# Patient Record
Sex: Female | Born: 1965
Health system: Southern US, Community
[De-identification: ages and names within clinical notes are randomized; demographics above are authoritative.]

## PROBLEM LIST (undated history)

## (undated) DIAGNOSIS — T7840XA Allergy, unspecified, initial encounter: Secondary | ICD-10-CM

## (undated) DIAGNOSIS — F419 Anxiety disorder, unspecified: Secondary | ICD-10-CM

## (undated) HISTORY — DX: Allergy, unspecified, initial encounter: T78.40XA

## (undated) HISTORY — DX: Anxiety disorder, unspecified: F41.9

---

## 2004-10-01 ENCOUNTER — Emergency Department: Payer: Self-pay | Admitting: Emergency Medicine

## 2004-10-03 ENCOUNTER — Emergency Department: Payer: Self-pay | Admitting: General Practice

## 2005-02-28 ENCOUNTER — Other Ambulatory Visit: Payer: Self-pay

## 2005-02-28 ENCOUNTER — Emergency Department: Payer: Self-pay | Admitting: Unknown Physician Specialty

## 2005-02-28 IMAGING — CT CT HEAD WITHOUT CONTRAST
1 series · 16 of 29 positions shown, 20 images · non-contrast
Comparison: none

REASON FOR EXAM: syncope   cardiac  rm
COMMENTS:

PROCEDURE:     CT  - CT HEAD WITHOUT CONTRAST  - [DATE] [DATE]
RESULT:        No intra-axial or extra-axial pathologic fluid or blood
collections are identified.  No mass lesion is noted.  There is no
hydrocephalus.

[Series 2: head 5.0 h40s · axial · 0.38mm/px · z∈[+191,+321]mm · 16 of 29 slices shown, 20 images]
[im 2/29  brain]
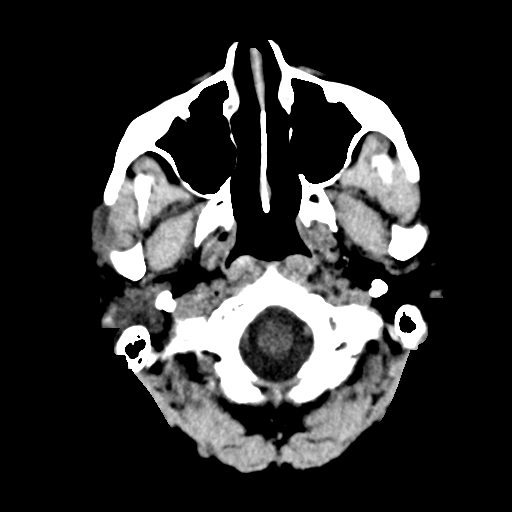
[im 2/29  bone]
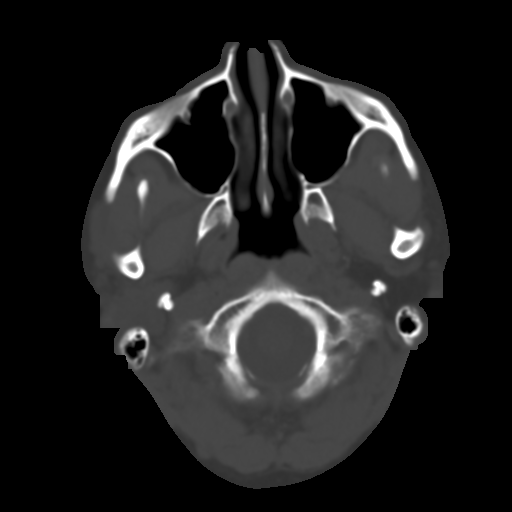
[im 4/29  brain]
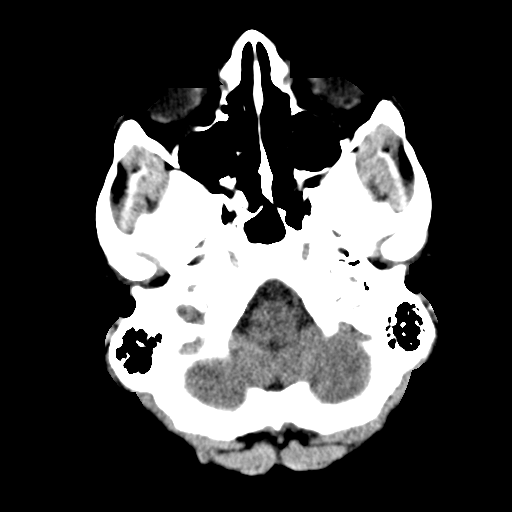
[im 6/29  brain]
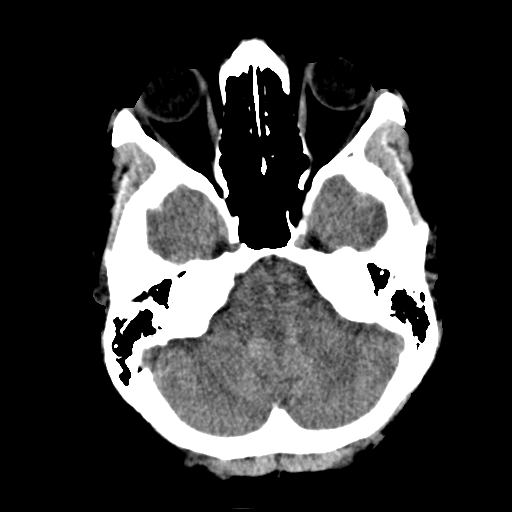
[im 7/29  brain]
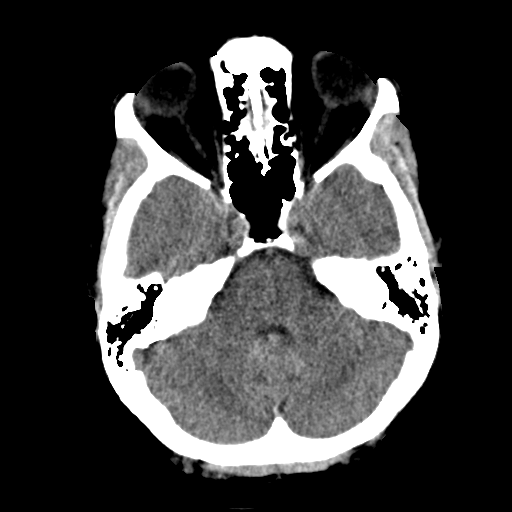
[im 9/29  brain]
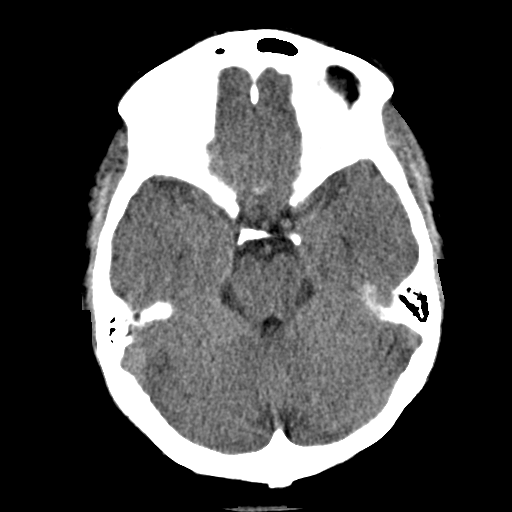
[im 9/29  bone]
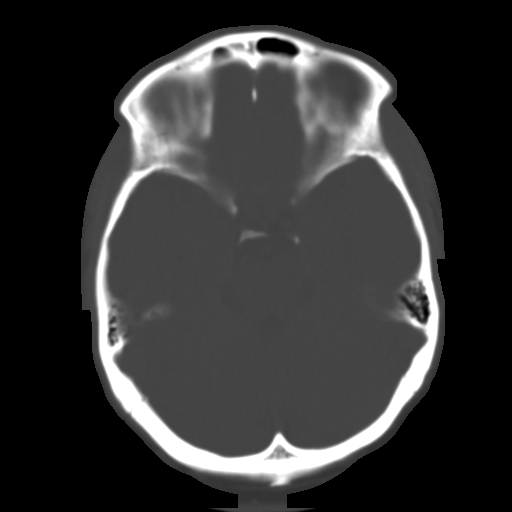
[im 11/29  brain]
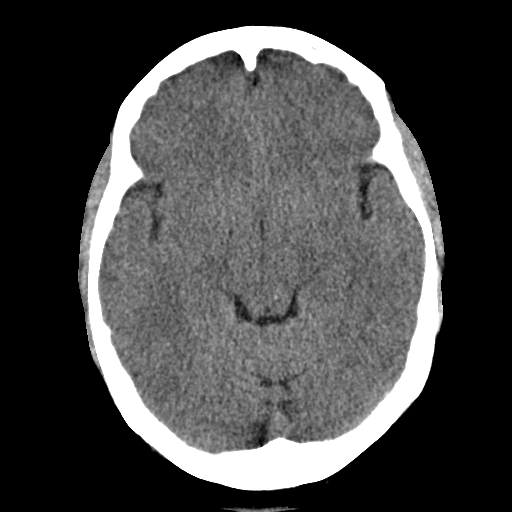
[im 12/29  brain]
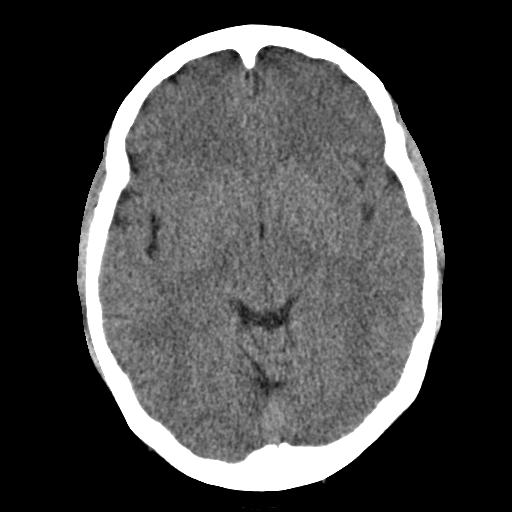
[im 14/29  brain]
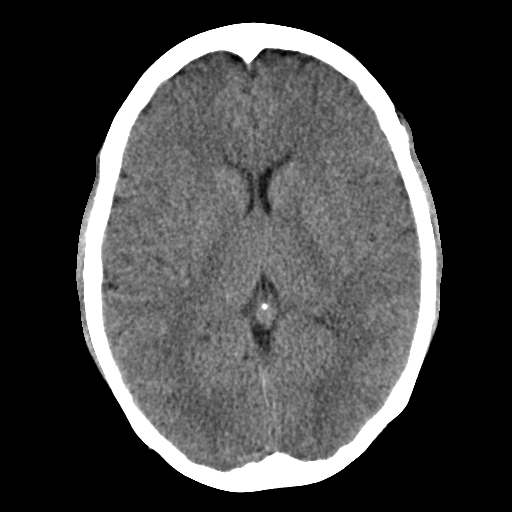
[im 16/29  brain]
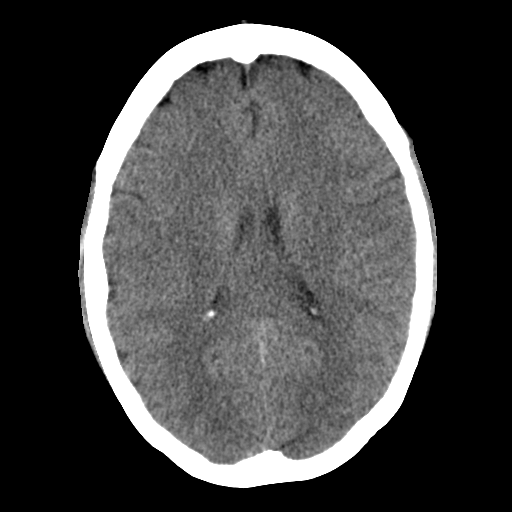
[im 16/29  bone]
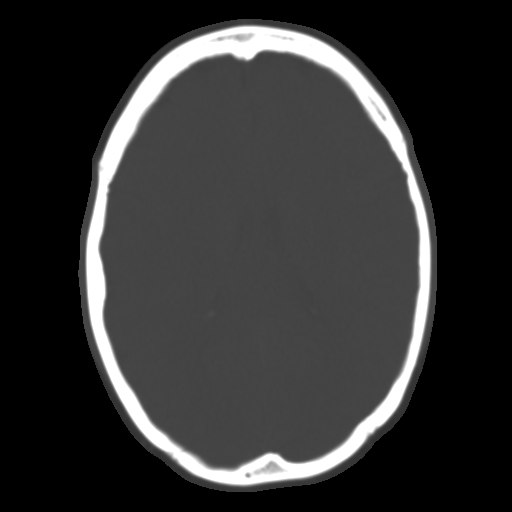
[im 18/29  brain]
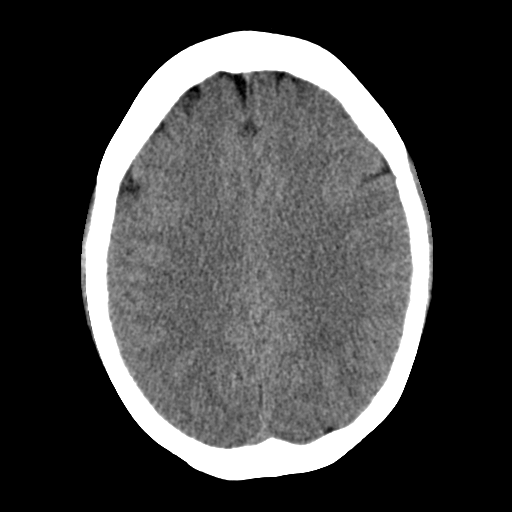
[im 19/29  brain]
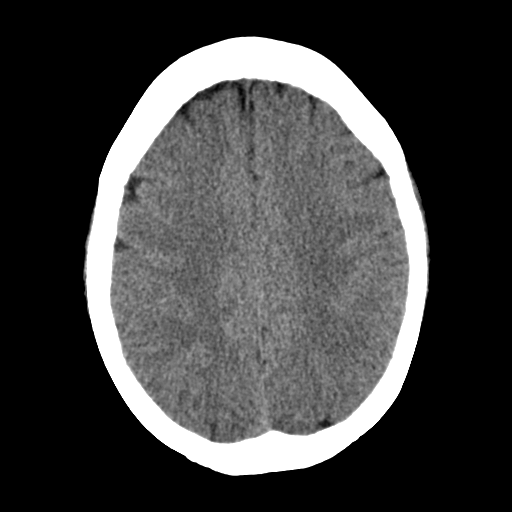
[im 21/29  brain]
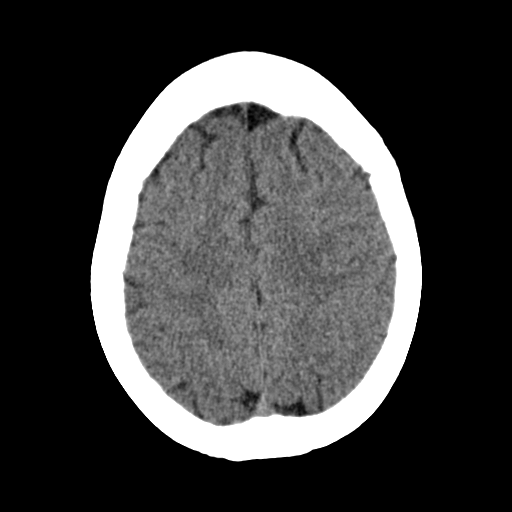
[im 23/29  brain]
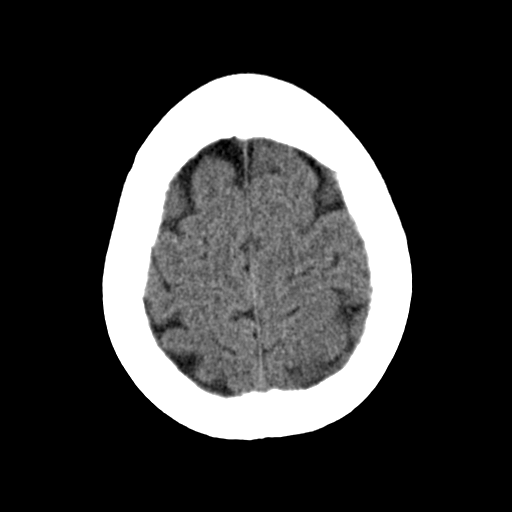
[im 23/29  bone]
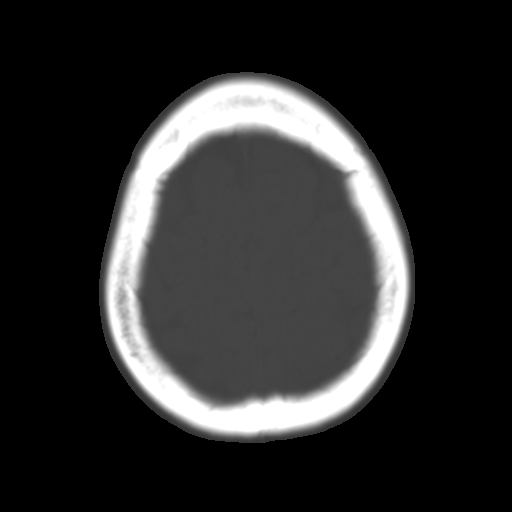
[im 24/29  brain]
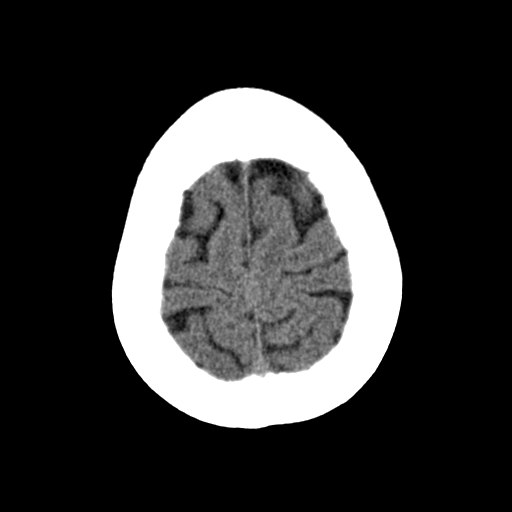
[im 26/29  brain]
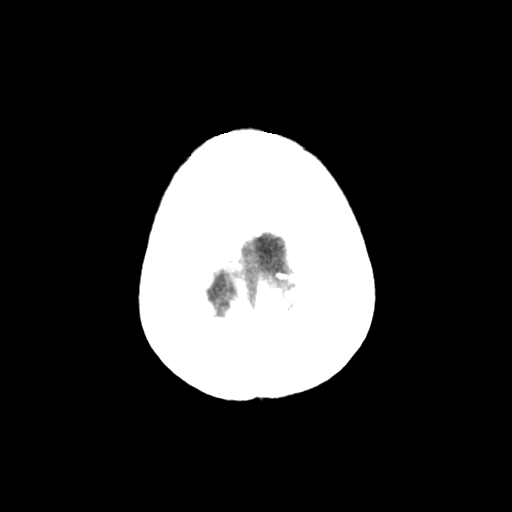
[im 28/29  brain]
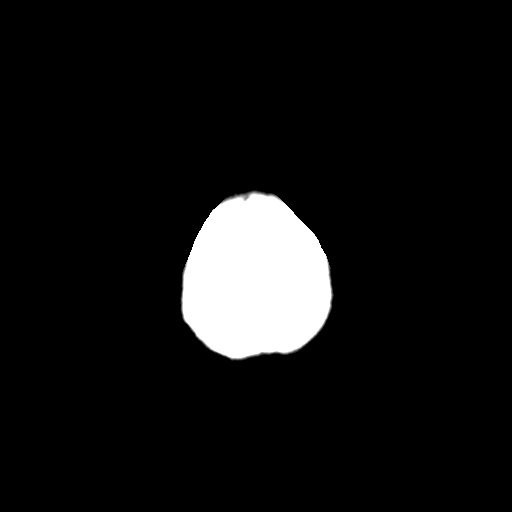

[16 of 29 positions shown; findings below may reference images not displayed]

IMPRESSION: No acute abnormalities.

## 2005-10-17 ENCOUNTER — Ambulatory Visit: Payer: Self-pay

## 2005-10-17 IMAGING — MG UNKNOWN MG STUDY
1 series · 4 of 4 positions shown · non-contrast
Comparison: none

REASON FOR EXAM: Baseline screening mammography
COMMENTS:

PROCEDURE:     MAM - MAM DGTL SCREENING MAMMO W/CAD  - [DATE]  [DATE]
RESULT:        The breasts demonstrate a heterogenous fibronodular
parenchymal pattern.  There does not appear to be radiographic evidence to
suggest malignancy.

[R CC · right · 4 of 4 slices shown]
[im 1/4]
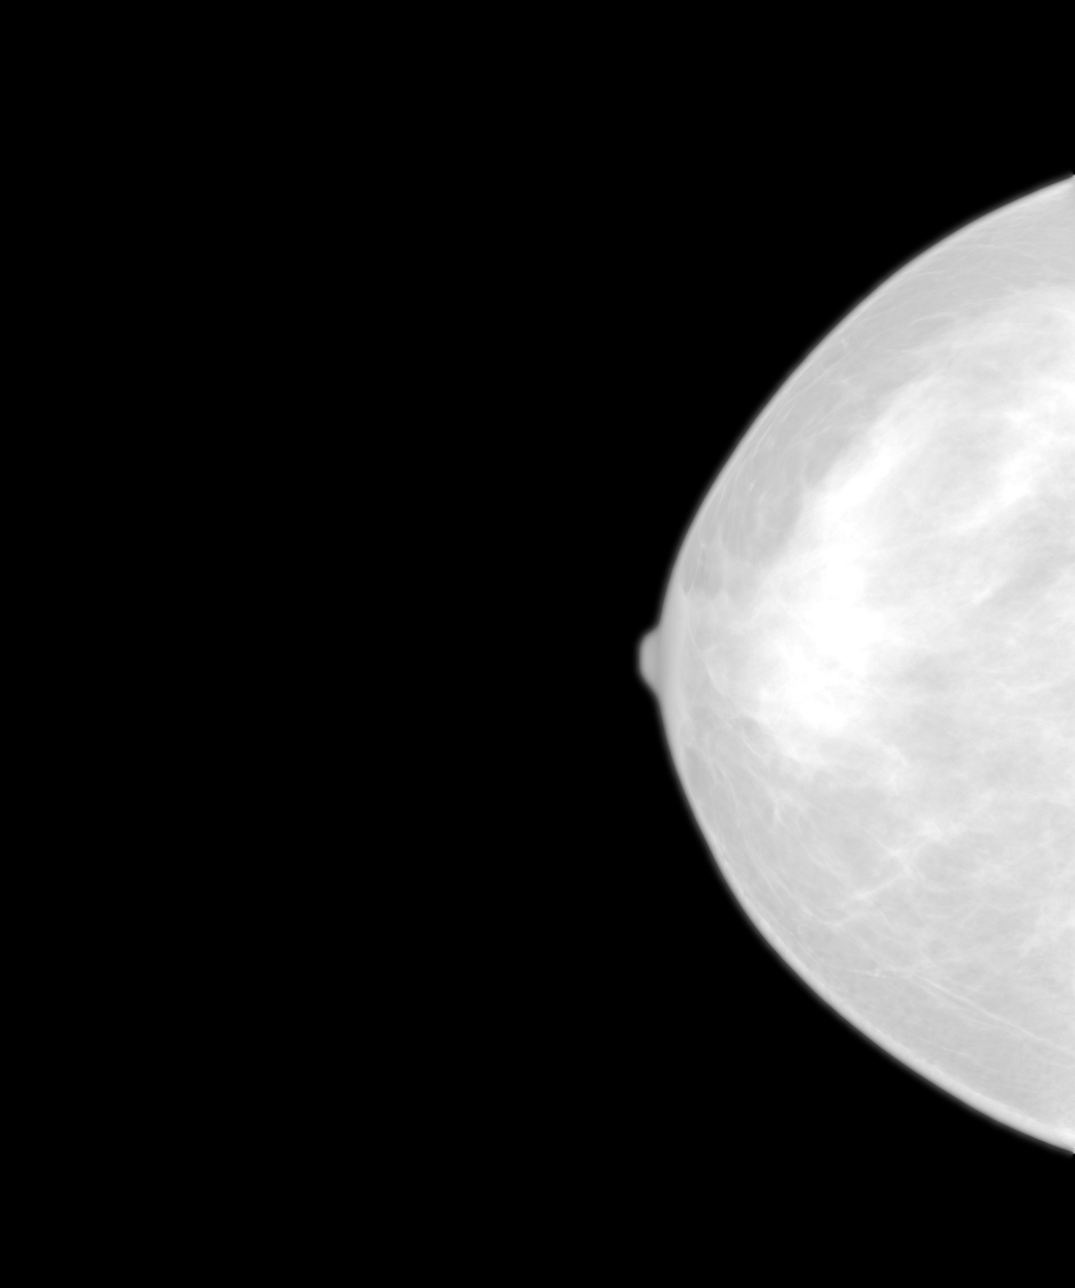
[im 2/4]
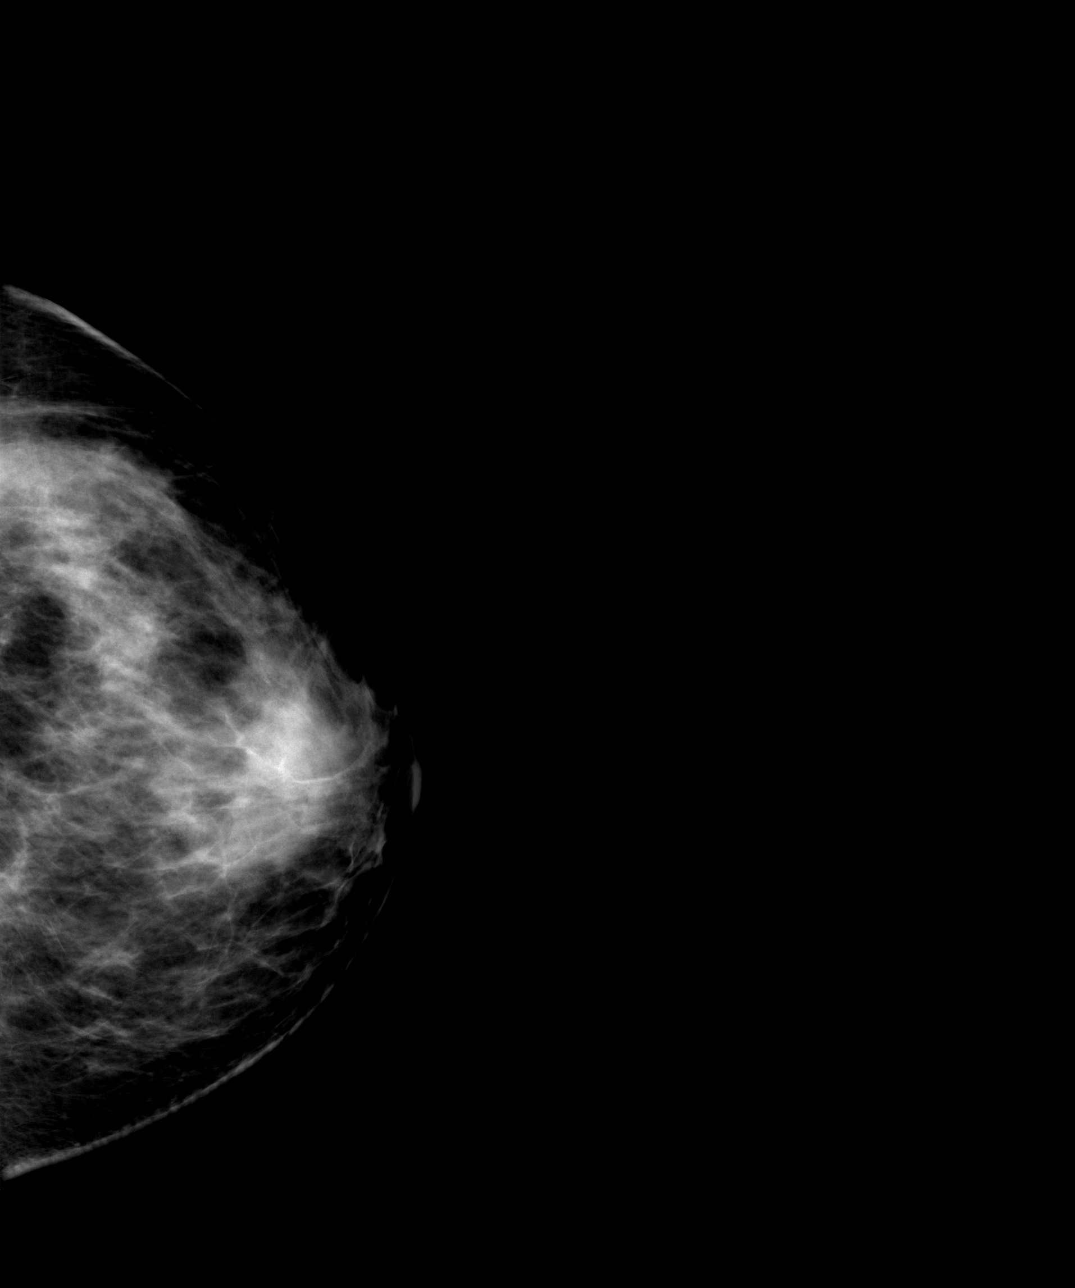
[im 3/4]
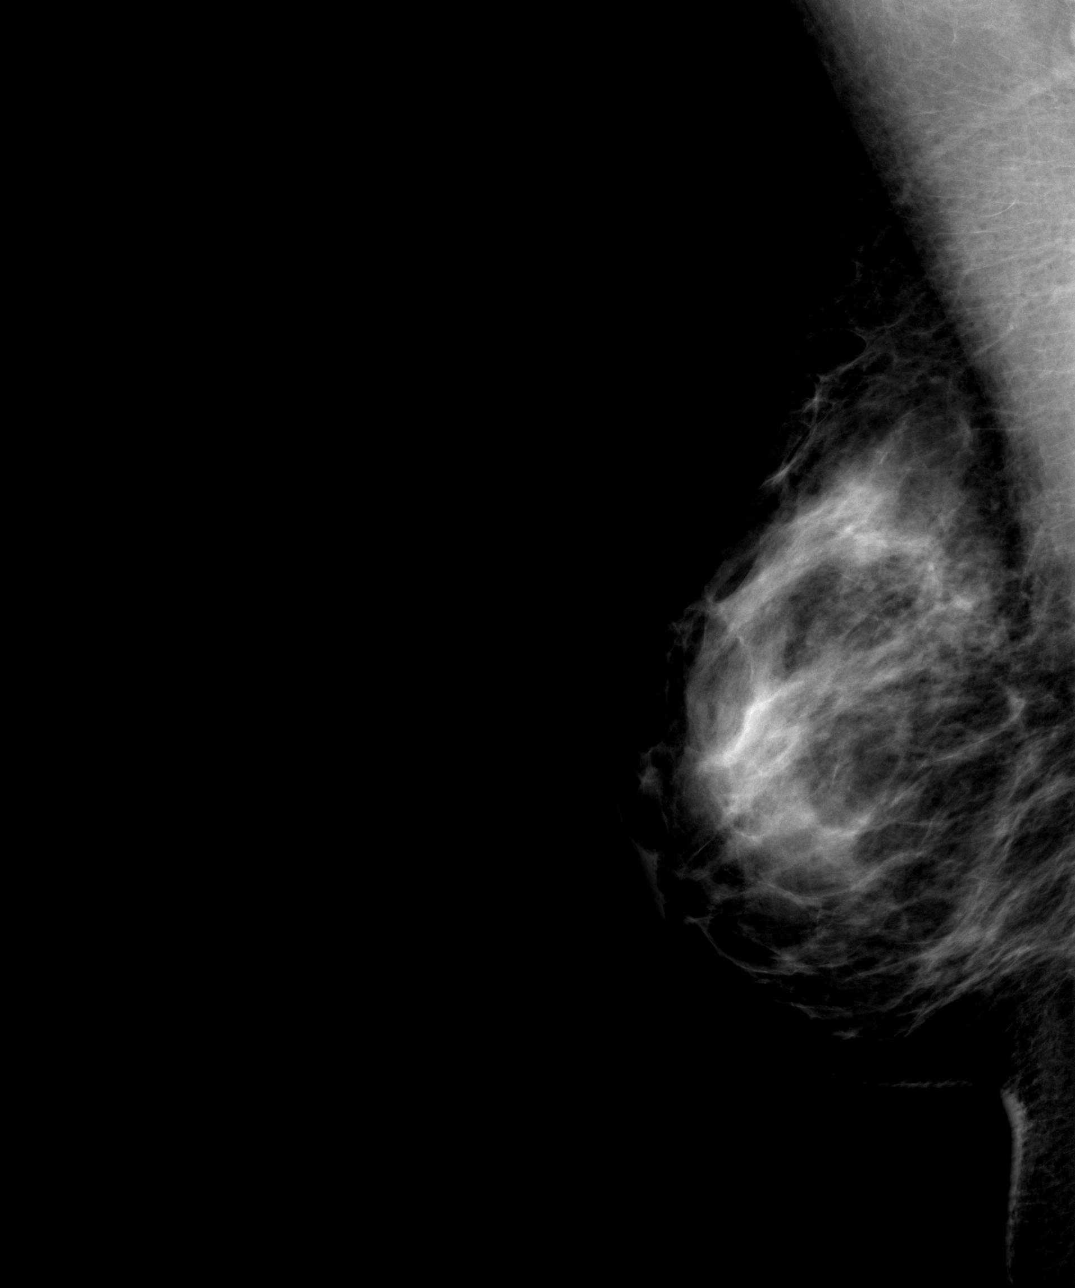
[im 4/4]
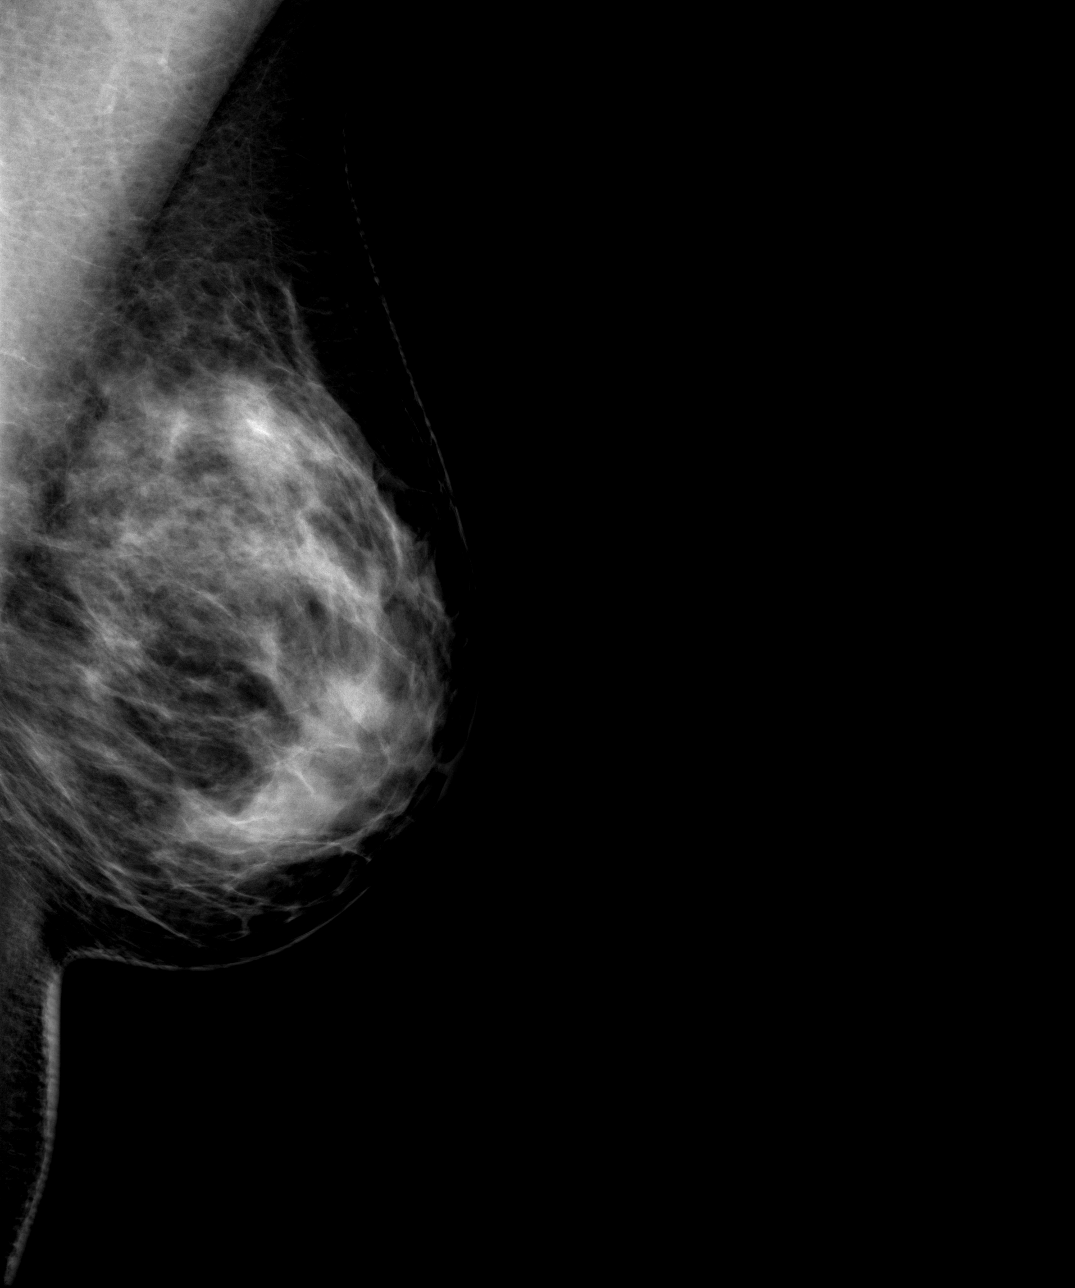

[4 of 4 positions shown; findings below may reference images not displayed]

IMPRESSION: 1.     Benign findings .
2.     BI-RADS: Category 2-Benign Findings.

Thank you for this opportunity to contribute to the care of your patient.

A NEGATIVE MAMMOGRAM REPORT DOES NOT PRECLUDE BIOPSY OR OTHER EVALUATION OF
A CLINICALLY PALPABLE OR OTHERWISE SUSPICIOUS MASS OR LESION.   BREAST

## 2006-03-25 ENCOUNTER — Ambulatory Visit: Payer: Self-pay | Admitting: Urology

## 2006-03-25 IMAGING — CR DG IVP HYPERTENSIVE
1 series · 8 of 10 positions shown · non-contrast
Comparison: none

REASON FOR EXAM: hematuria
COMMENTS:

[Series 1: view not recorded · 0.17mm/px · 8 of 12 slices shown]
[im 1/12]
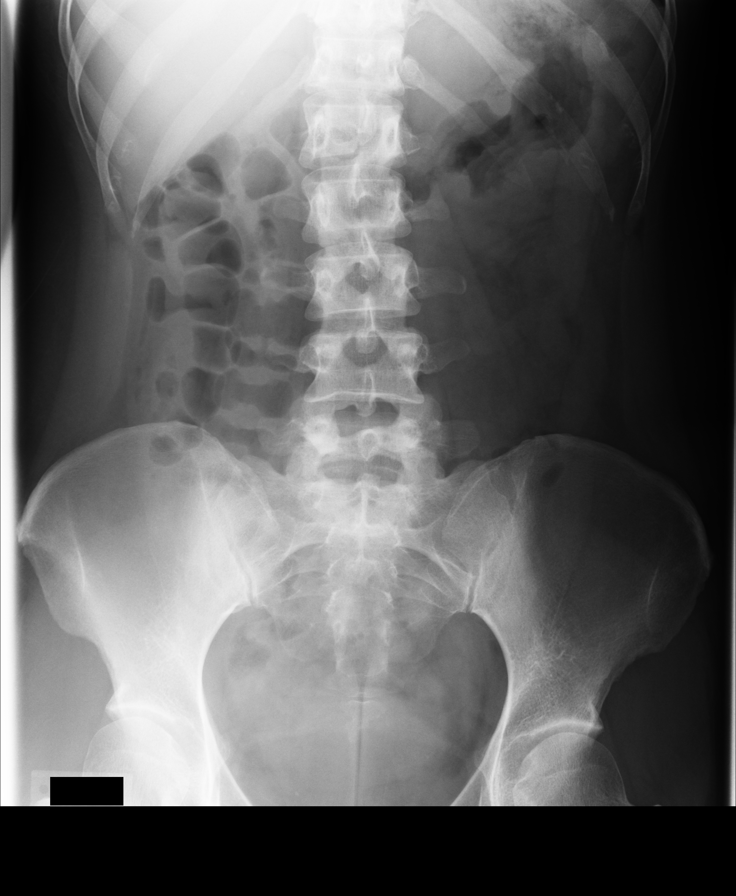
[im 2/12]
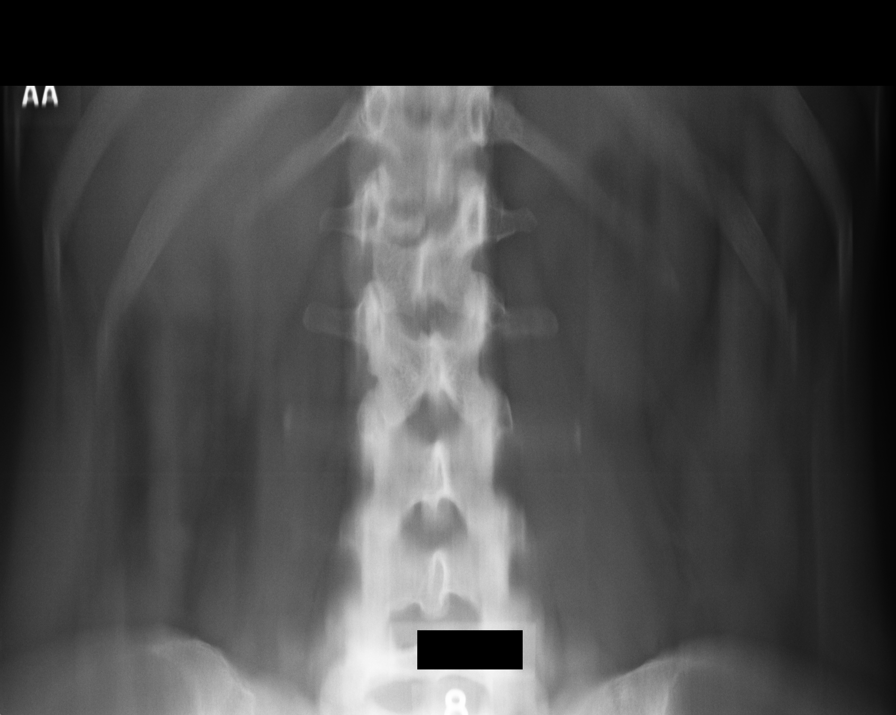
[im 3/12]
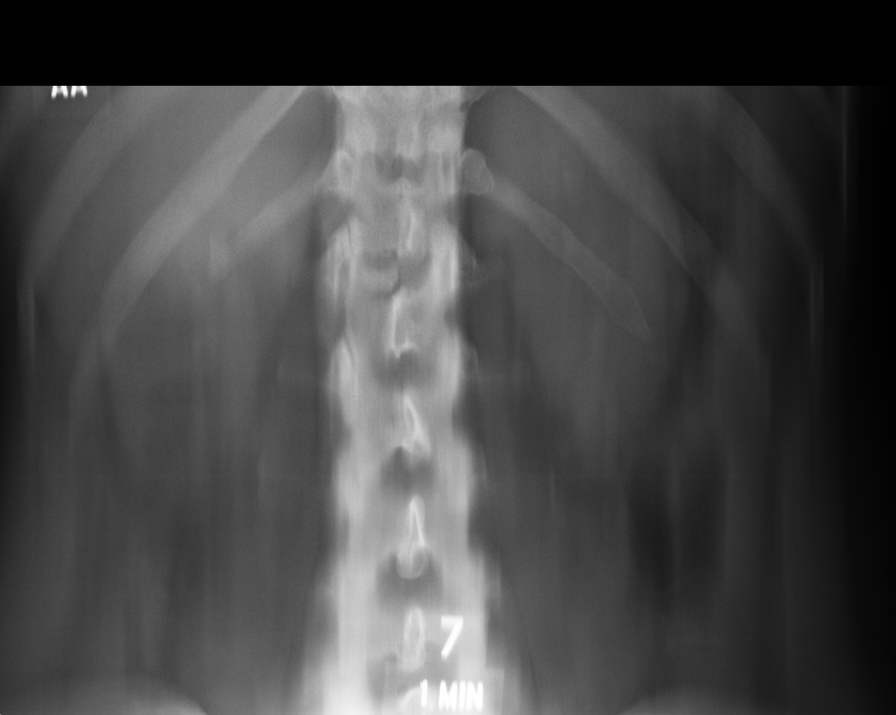
[im 4/12]
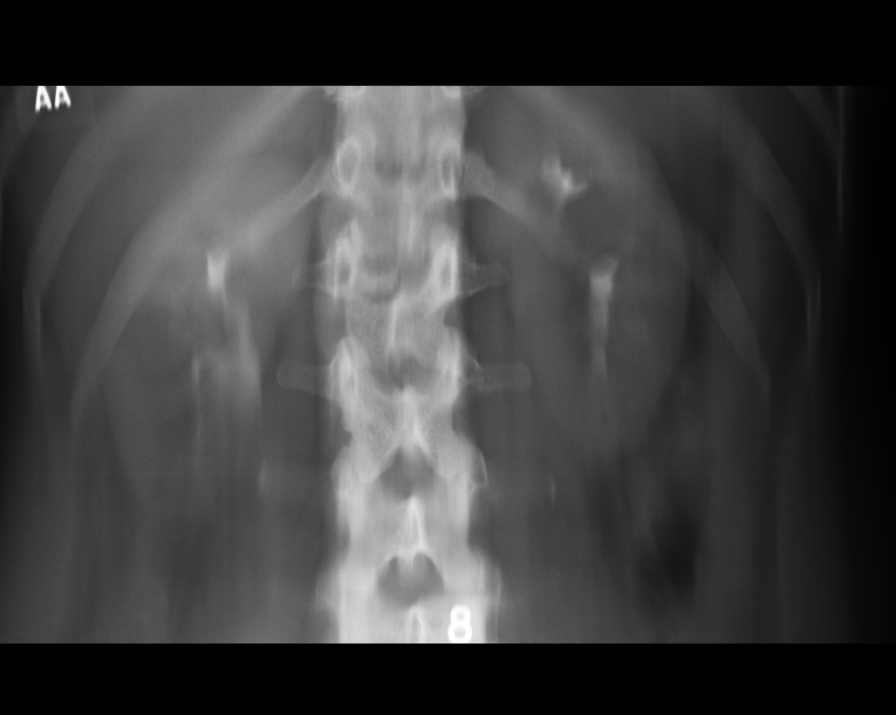
[im 5/12]
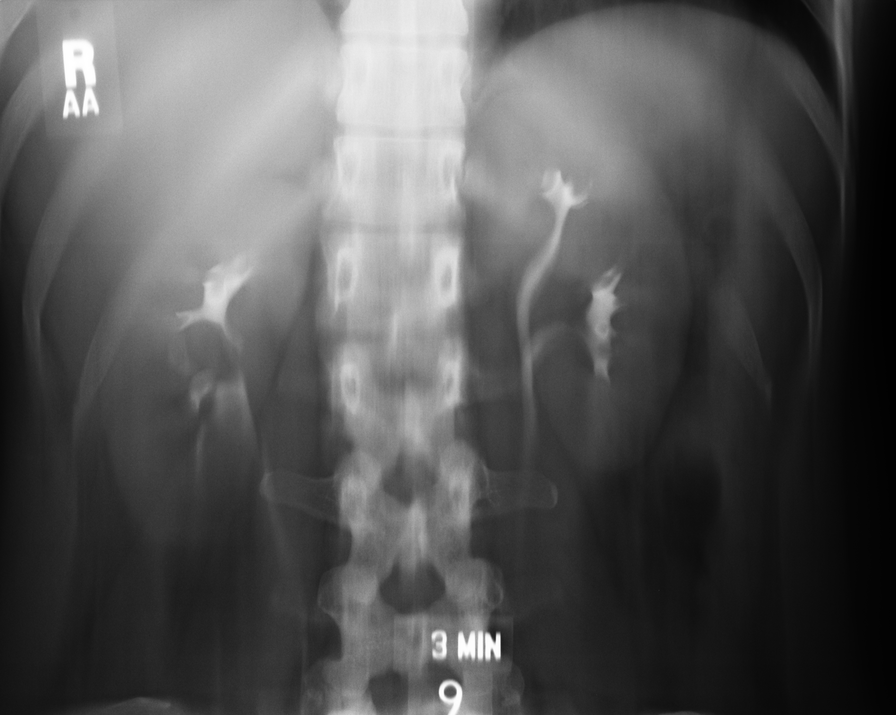
[im 7/12]
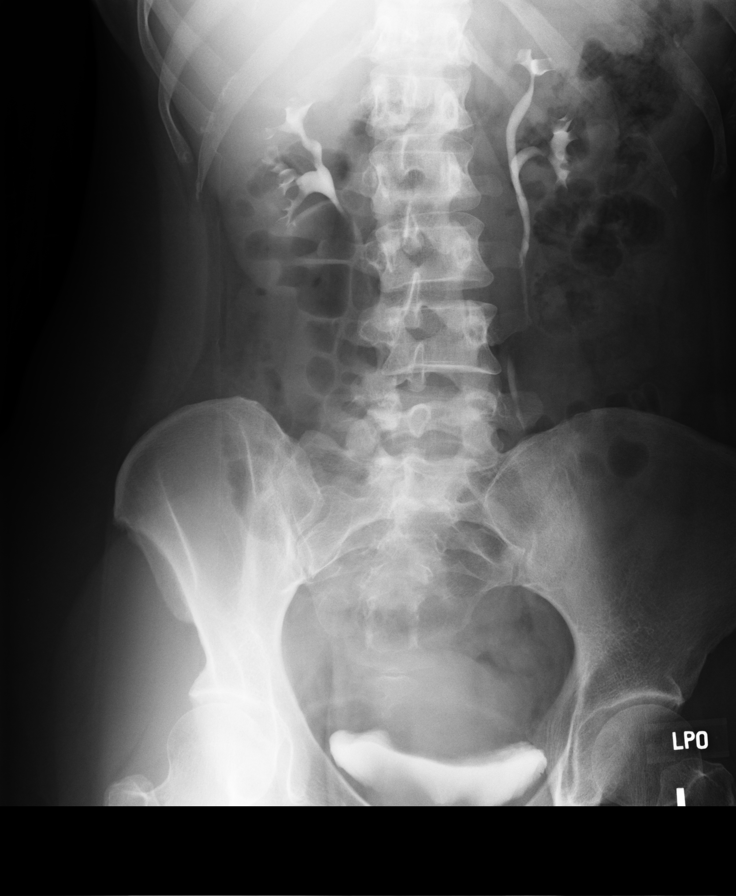
[im 8/12]
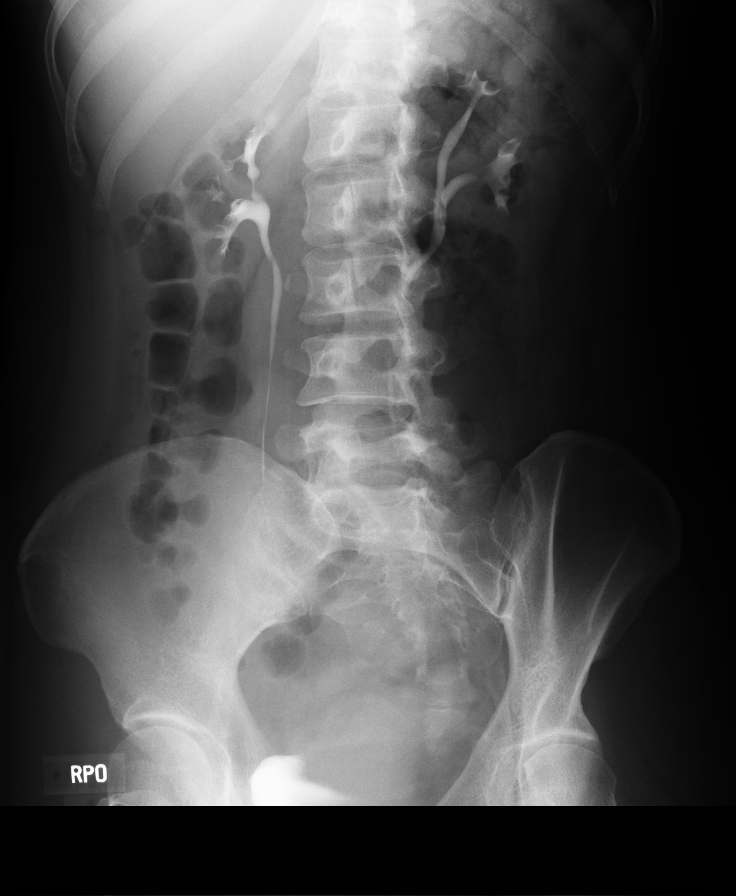
[im 9/12]
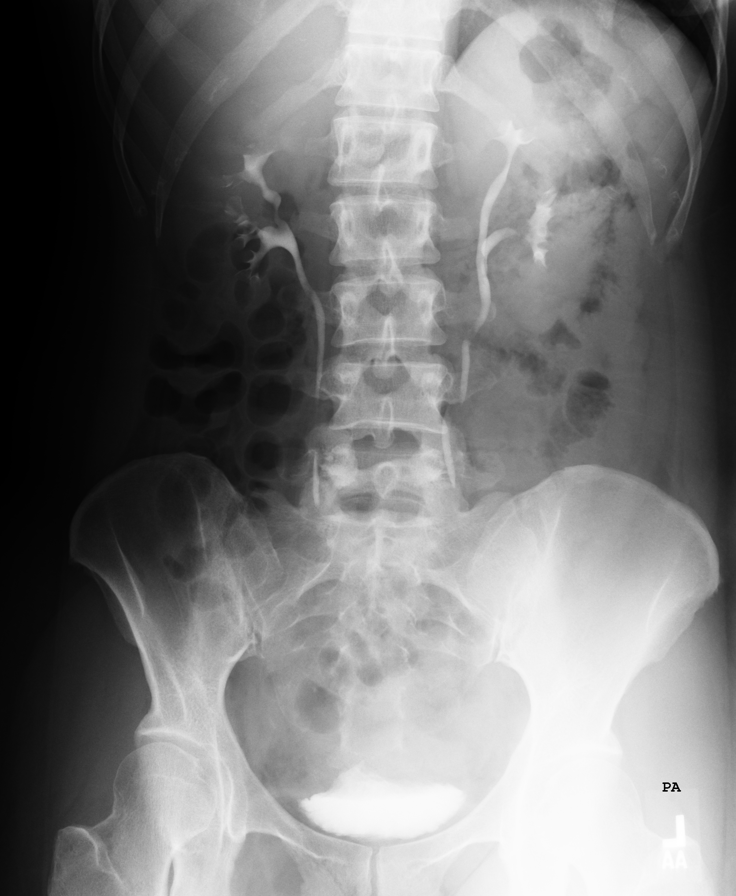

[8 of 10 positions shown; findings below may reference images not displayed]

PROCEDURE:     DXR - DXR INTRAVENOUS UROGRAPHY (IVP)  - [DATE]  [DATE]

RESULT:     The patient has hematuria and clinical bladder infection.  The
patient has no history of kidney stones. The patient is complaining of lower
back pain.

The anticipated procedure was discussed with Ms. ITTI. She voiced her
willingness to proceed.  The scout film reveals a normal bowel gas pattern.
Following intravenous administration of 50 ml of Optiray 320 there is prompt
visualization of the renal outlines.  The RIGHT kidney measures
approximately 12.5 cm in length and the LEFT kidney measures approximately
13.2 cm in length, uncorrected for magnification.  The intrarenal collecting
systems are normal in appearance. The ureters are fractionally visualized
but appeared normal in course and caliber. The partially distended urinary
bladder is normal in appearance. The postvoid film reveals no more than a
small residual urinary bladder volume.
IMPRESSION: 1)Normal IVP.

## 2006-04-17 ENCOUNTER — Ambulatory Visit: Payer: Self-pay | Admitting: Urology

## 2006-05-16 ENCOUNTER — Ambulatory Visit: Payer: Self-pay | Admitting: Gastroenterology

## 2007-10-30 ENCOUNTER — Ambulatory Visit: Payer: Self-pay | Admitting: Internal Medicine

## 2007-10-30 IMAGING — CT CT CHEST W/ CM
1 series · 15 of 32 positions shown, 19 images · non-contrast
Comparison: none

REASON FOR EXAM: CP   SOB   NOT STAT PER OFFICE
COMMENTS:

[Series 4: soft tissue · axial · 0.63mm/px · z∈[-425,-164]mm · 15 of 98 slices shown, 19 images]
[im 7/98  soft-tissue]
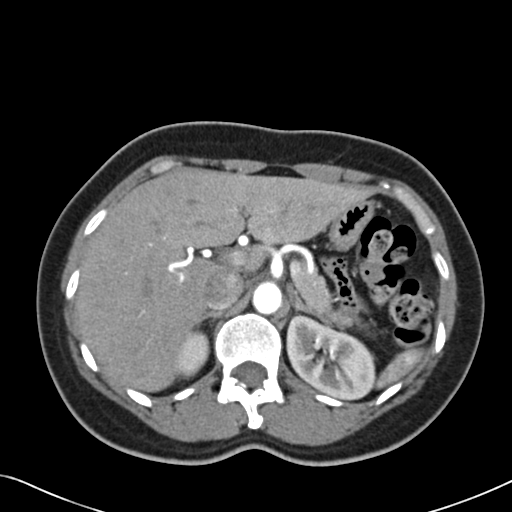
[im 7/98  bone]
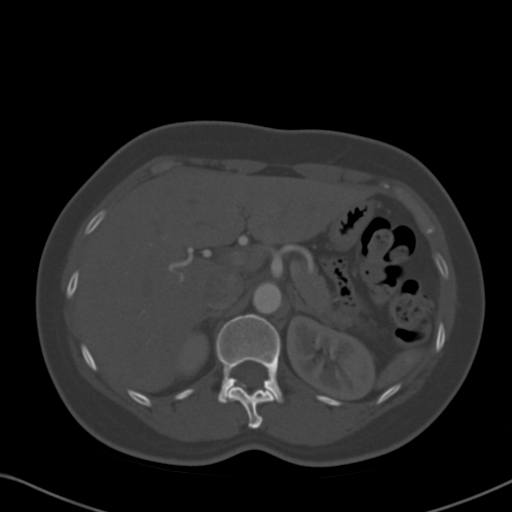
[im 13/98  soft-tissue]
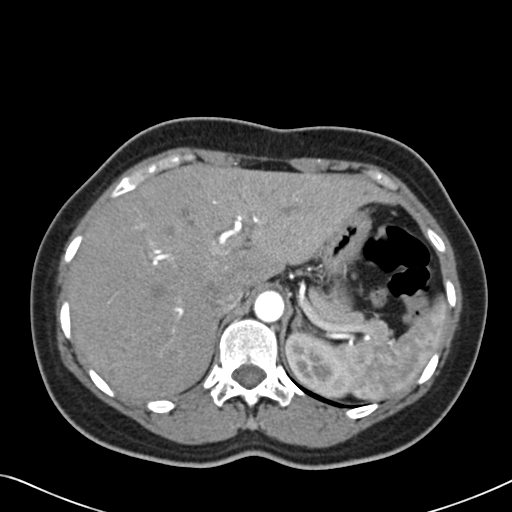
[im 19/98  soft-tissue]
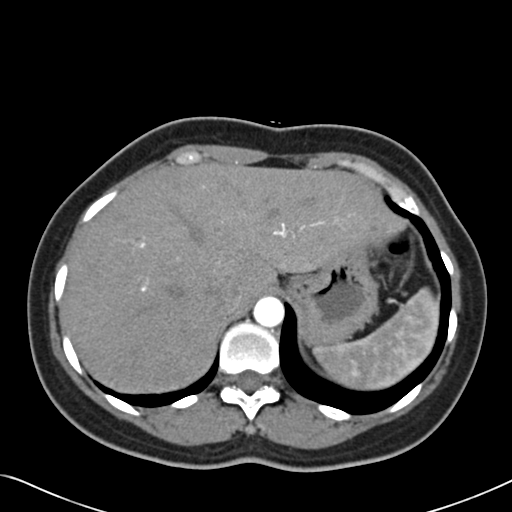
[im 29/98  soft-tissue]
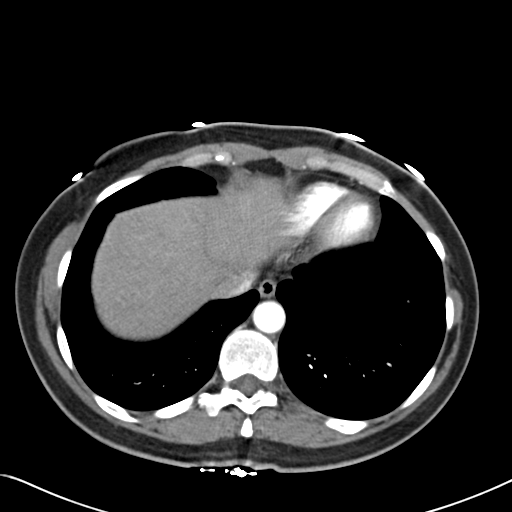
[im 35/98  soft-tissue]
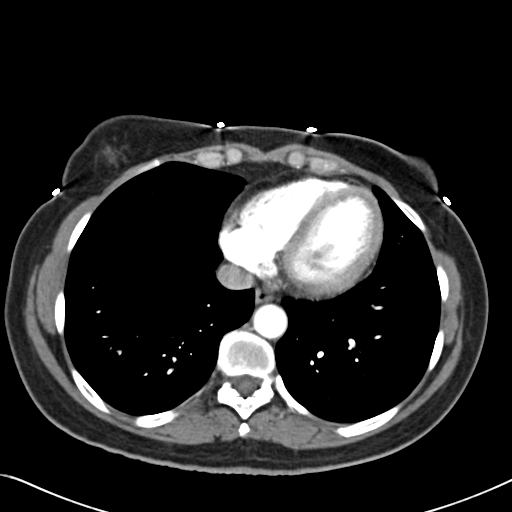
[im 41/98  soft-tissue]
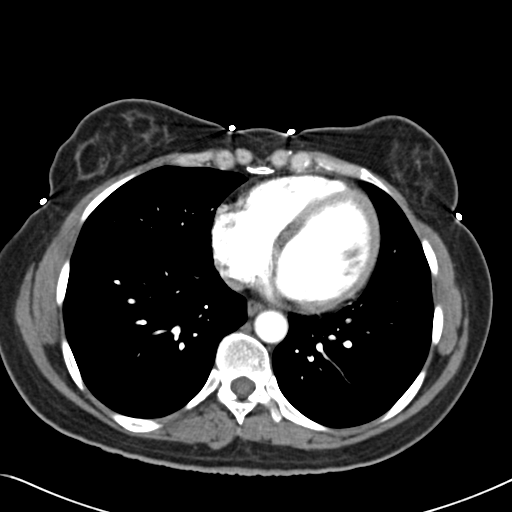
[im 51/98  soft-tissue]
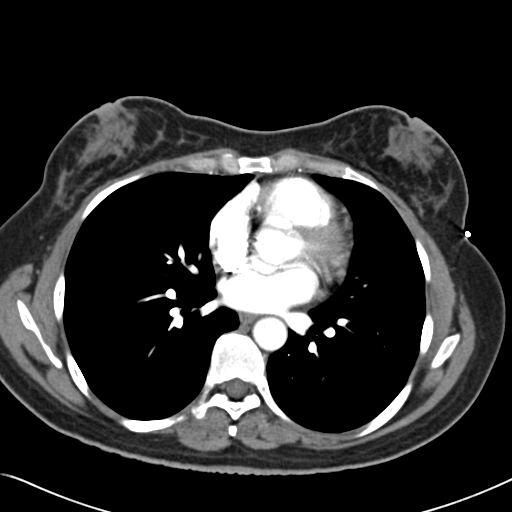
[im 57/98  soft-tissue]
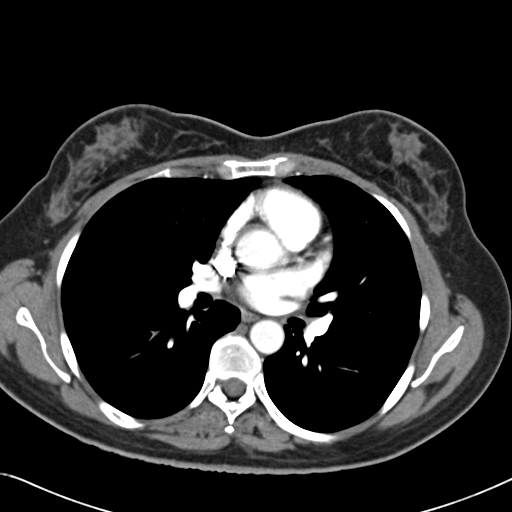
[im 63/98  soft-tissue]
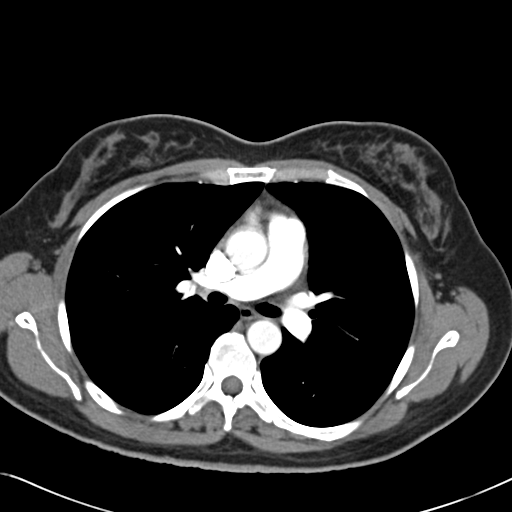
[im 63/98  bone]
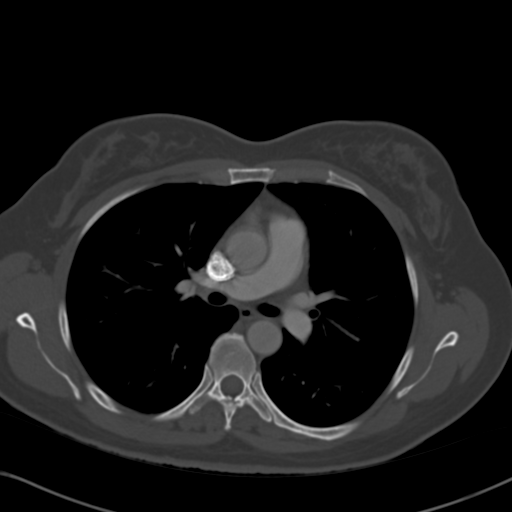
[im 69/98  soft-tissue]
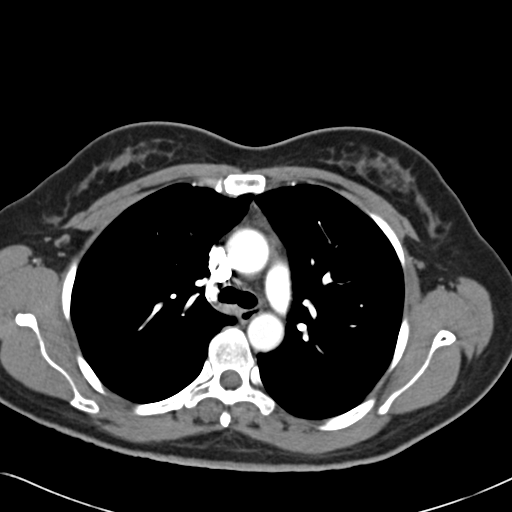
[im 79/98  soft-tissue]
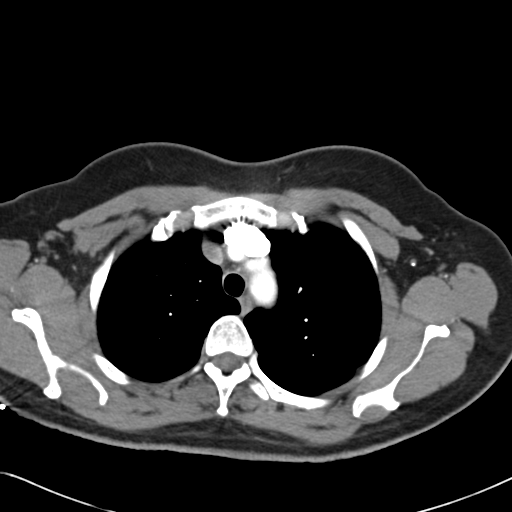
[im 85/98  soft-tissue]
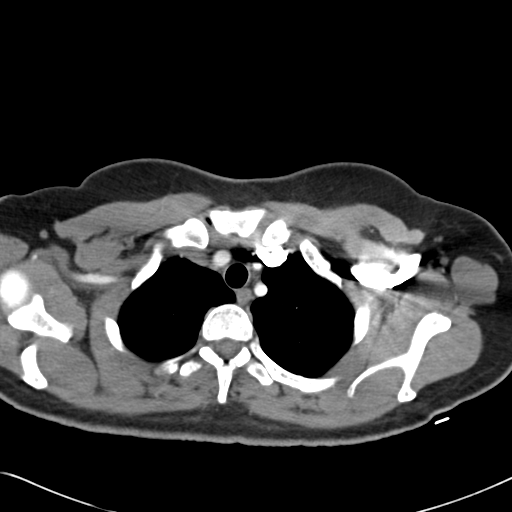
[im 85/98  lung]
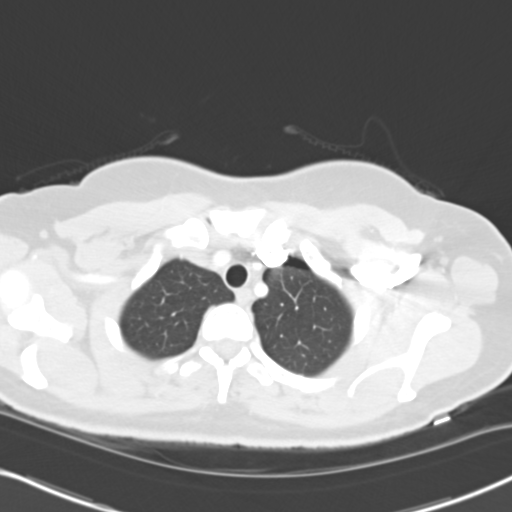
[im 88/98  lung]
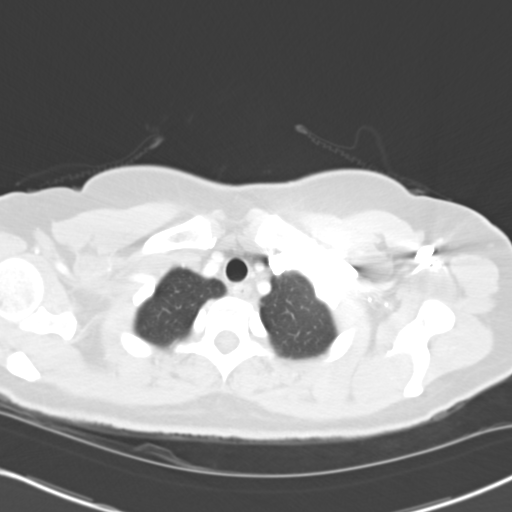
[im 91/98  soft-tissue]
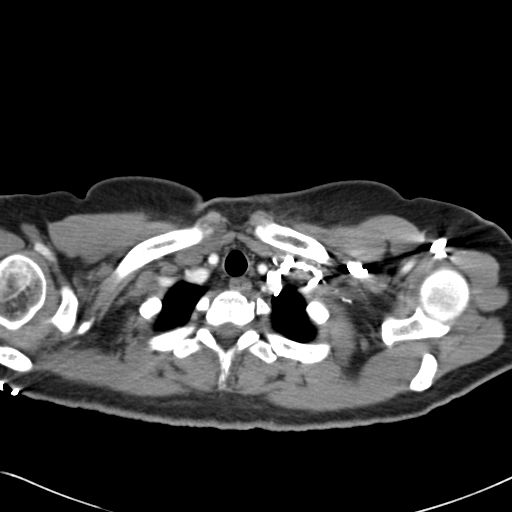
[im 91/98  lung]
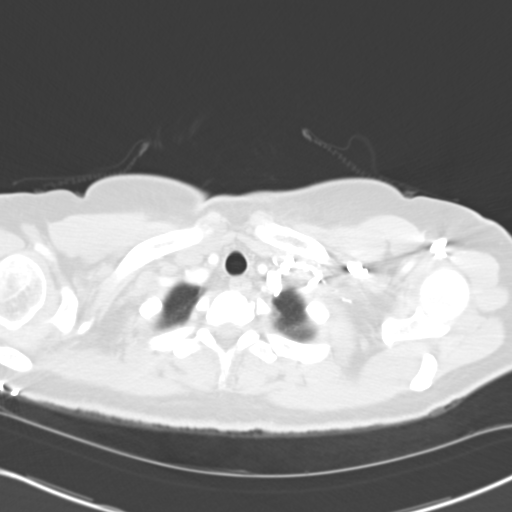
[im 94/98  lung]
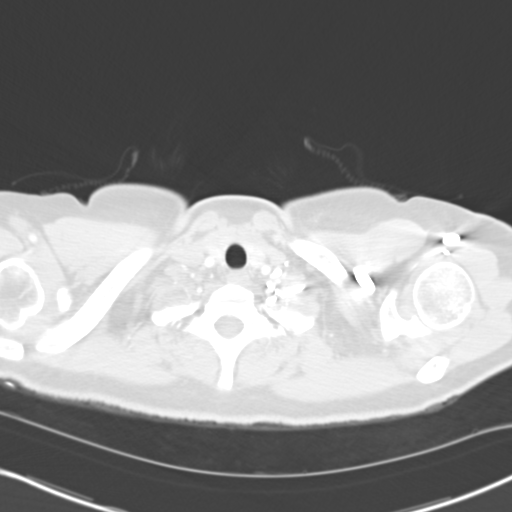

[15 of 32 positions shown; findings below may reference images not displayed]

PROCEDURE:     CT  - CT CHEST (FOR PE) W  - [DATE] [DATE]

RESULT:     The patient received 75 ml of [FW]. The patient is
undergoing evaluation for unexplained chest discomfort.

The caliber of the thoracic aorta is normal. Contrast within the pulmonary
arterial tree also appears normal. The cardiac chambers are normal in size.
No pathologic sized mediastinal or hilar lymph nodes are seen. There is no
pleural or pericardial effusion. There is no evidence of axillary
lymphadenopathy. The thyroid lobes are normal where visualized. At lung
window settings I see no interstitial or alveolar infiltrates. Within the
upper abdomen the observed portions of the liver are normal. There are no
adrenal masses. The spleen is not enlarged. The thoracic vertebral bodies
are preserved in height.
IMPRESSION: 1. There is no evidence of acute pulmonary embolism or acute thoracic aortic
pathology.
2. There is no evidence of CHF or pneumonia or pulmonary parenchymal
lesions.
3. There is no pleural or pericardial effusion.

## 2008-12-21 ENCOUNTER — Ambulatory Visit: Payer: Self-pay

## 2010-11-07 ENCOUNTER — Ambulatory Visit: Payer: Self-pay | Admitting: Obstetrics and Gynecology

## 2010-11-07 IMAGING — MG MAM [PERSON_NAME] SCREENING W/CAD
1 series · 4 of 4 positions shown · non-contrast
Comparison: none

REASON FOR EXAM: OLANE SCR COPY TO OLANE
COMMENTS:

PROCEDURE:     MAM - MAM OLANE SCREENING W/CAD  - [DATE]  [DATE]
RESULT:
COMPARISONS: [DATE] and [DATE].

[R CC · right · 4 of 4 slices shown]
[im 1/4]
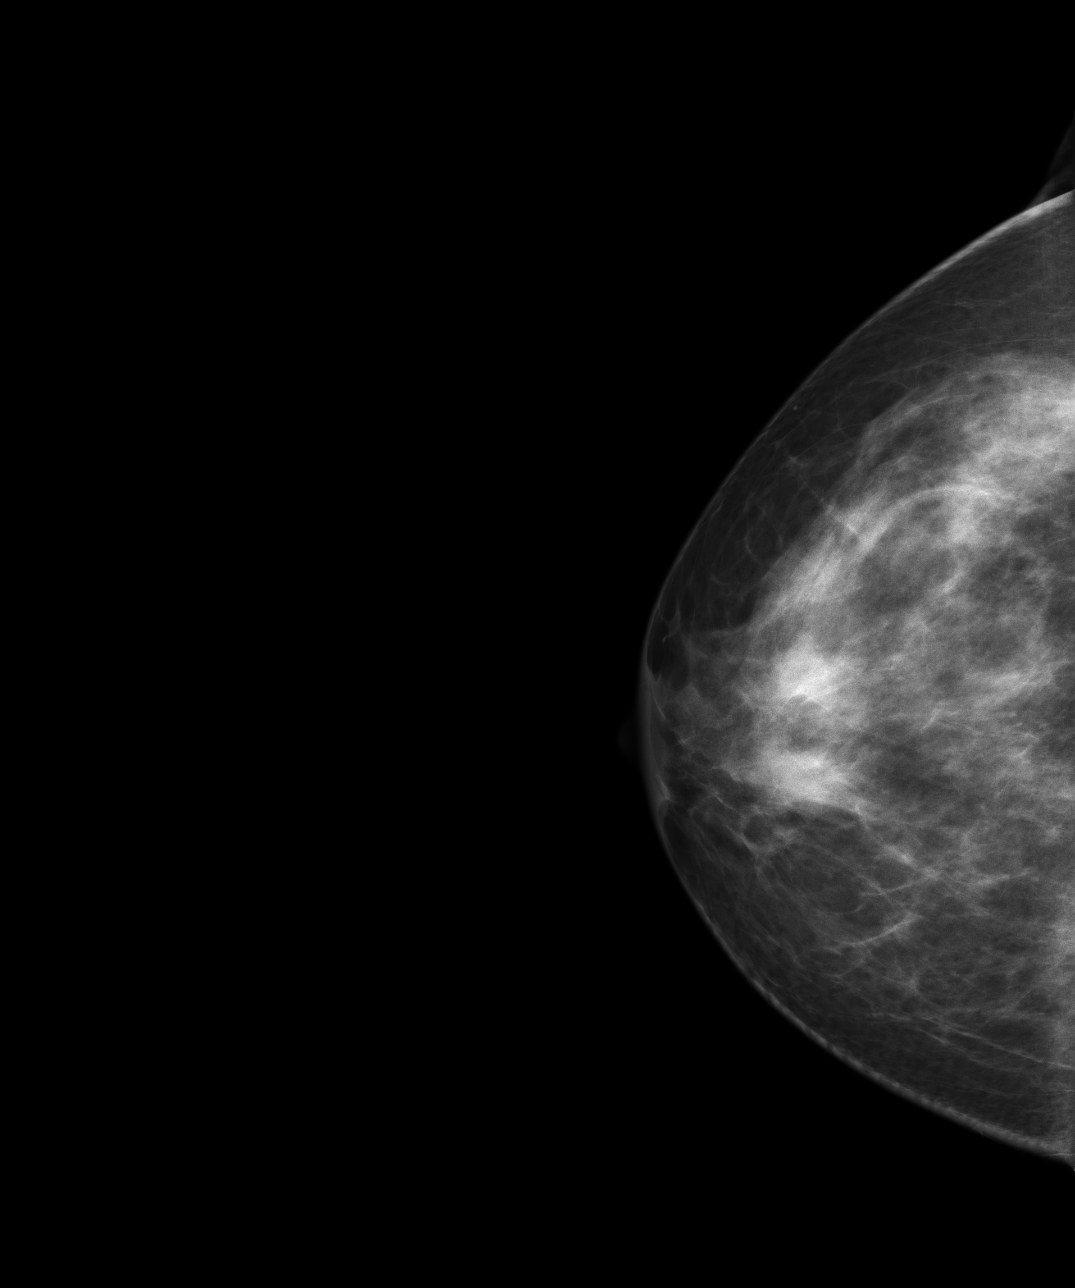
[im 2/4]
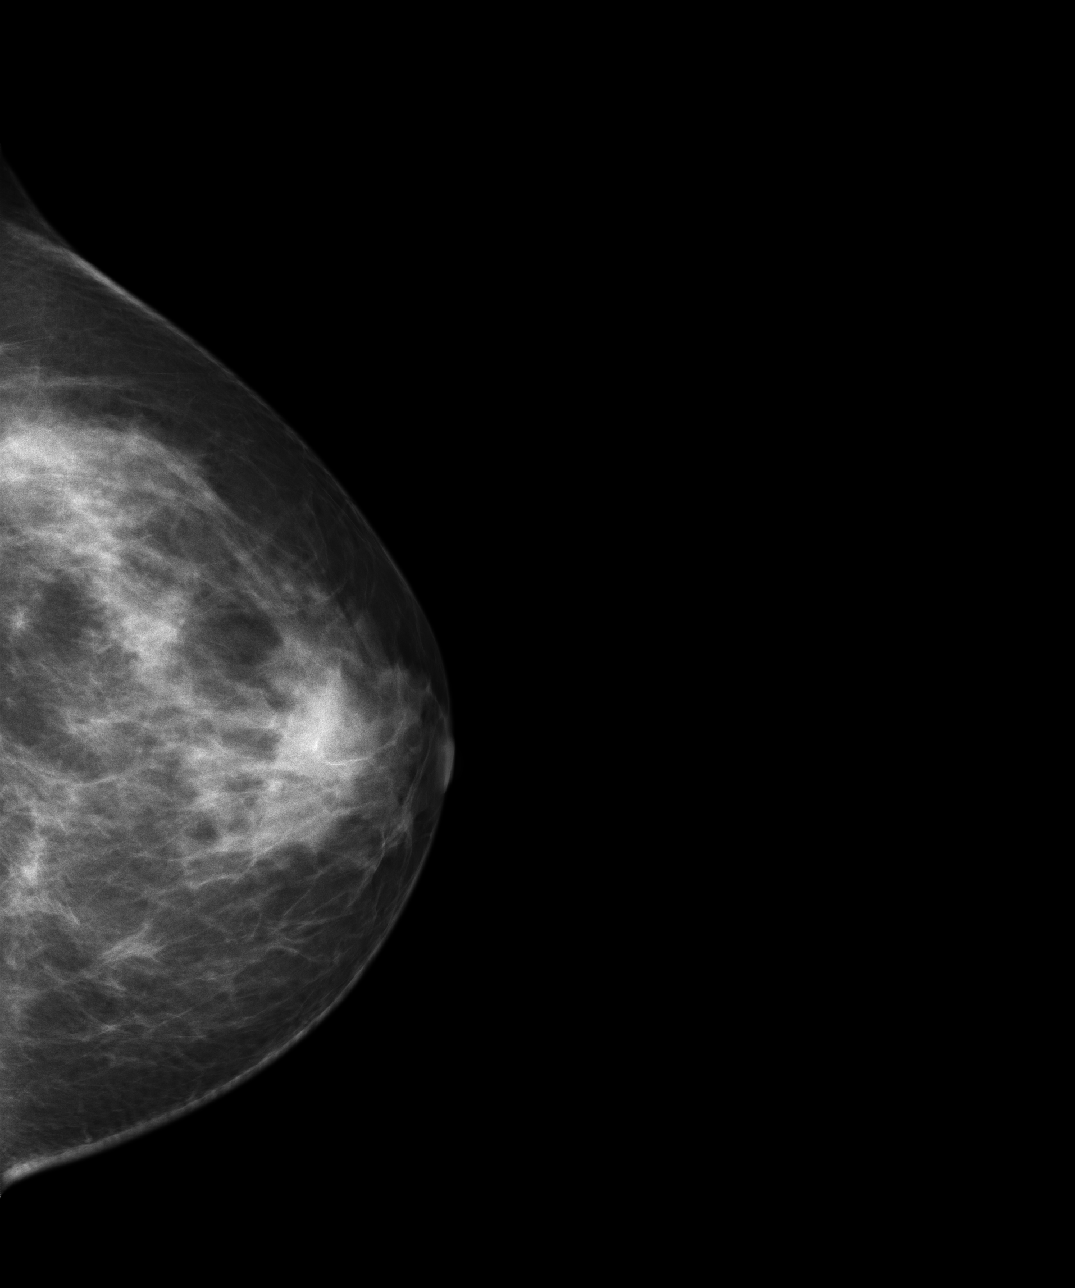
[im 3/4]
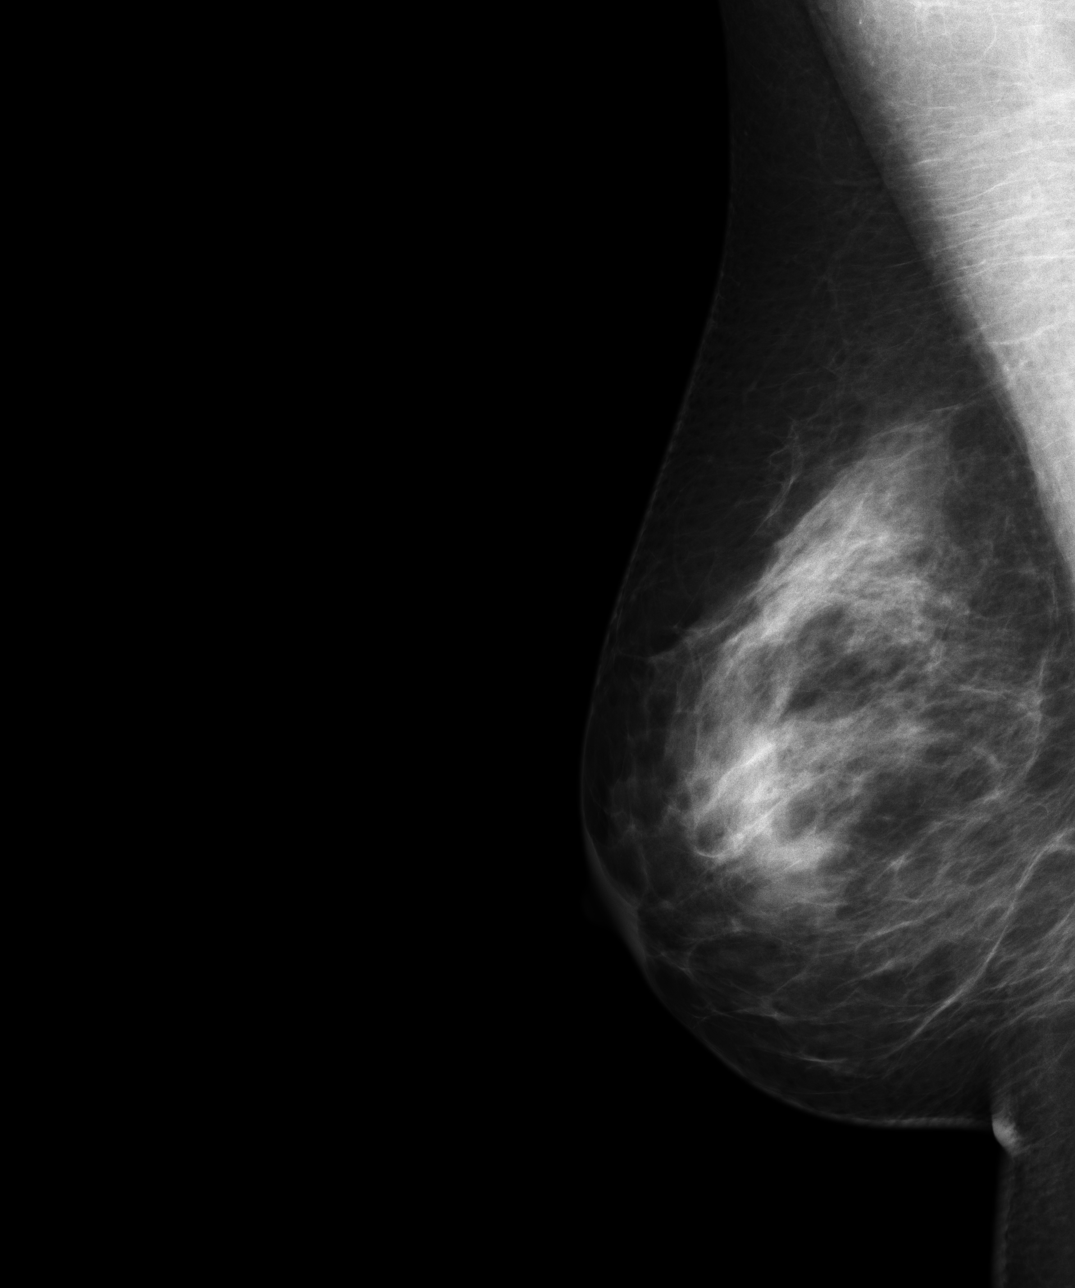
[im 4/4]
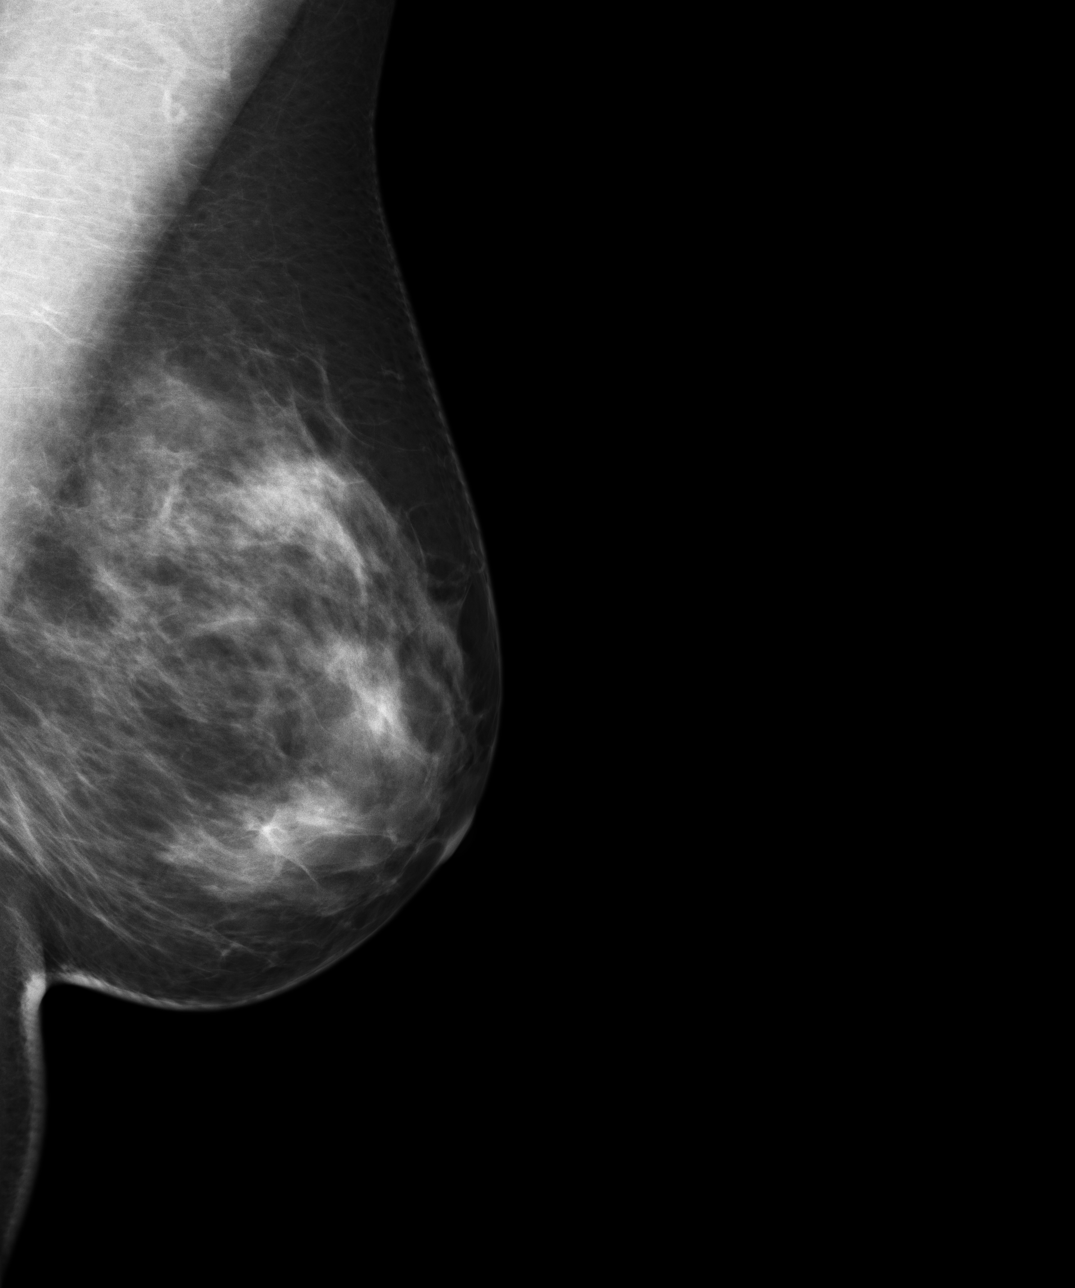

[4 of 4 positions shown; findings below may reference images not displayed]

FINDINGS: Please note the patient's films are labeled OLANE on
today's examination. The patient's prior examinations are listed under
OLANE.

The breast tissue is heterogeneously dense. No new or suspicious masses or
calcifications are identified. No areas of architectural distortion.
IMPRESSION: BI-RADS: Category 1 - Negative

Continue annual screening mammography.

A NEGATIVE MAMMOGRAM REPORT DOES NOT PRECLUDE BIOPSY OR OTHER EVALUATION OF
A CLINICALLY PALPABLE OR OTHERWISE SUSPICIOUS MASS OR LESION. BREAST CANCER
MAY NOT BE DETECTED BY MAMMOGRAPHY IN UP TO 10% OF CASES.

## 2013-01-27 ENCOUNTER — Ambulatory Visit: Payer: Self-pay

## 2013-01-29 ENCOUNTER — Ambulatory Visit: Payer: Self-pay

## 2015-03-22 ENCOUNTER — Emergency Department: Payer: Self-pay | Admitting: Emergency Medicine

## 2015-03-22 LAB — URINALYSIS, COMPLETE
Bacteria: NONE SEEN
Bilirubin,UR: NEGATIVE
Glucose,UR: NEGATIVE mg/dL (ref 0–75)
KETONE: NEGATIVE
Leukocyte Esterase: NEGATIVE
NITRITE: NEGATIVE
PH: 5 (ref 4.5–8.0)
Protein: NEGATIVE
RBC,UR: 3 /HPF (ref 0–5)
SPECIFIC GRAVITY: 1.021 (ref 1.003–1.030)
Squamous Epithelial: 6
WBC UR: 1 /HPF (ref 0–5)

## 2015-03-22 LAB — CBC WITH DIFFERENTIAL/PLATELET
BASOS ABS: 0 10*3/uL (ref 0.0–0.1)
Basophil %: 0.4 %
EOS ABS: 0.1 10*3/uL (ref 0.0–0.7)
Eosinophil %: 1.7 %
HCT: 40.9 % (ref 35.0–47.0)
HGB: 13.2 g/dL (ref 12.0–16.0)
Lymphocyte #: 1 10*3/uL (ref 1.0–3.6)
Lymphocyte %: 12.4 %
MCH: 27.9 pg (ref 26.0–34.0)
MCHC: 32.3 g/dL (ref 32.0–36.0)
MCV: 86 fL (ref 80–100)
MONO ABS: 0.4 x10 3/mm (ref 0.2–0.9)
Monocyte %: 4.8 %
NEUTROS PCT: 80.7 %
Neutrophil #: 6.3 10*3/uL (ref 1.4–6.5)
PLATELETS: 251 10*3/uL (ref 150–440)
RBC: 4.74 10*6/uL (ref 3.80–5.20)
RDW: 14.1 % (ref 11.5–14.5)
WBC: 7.8 10*3/uL (ref 3.6–11.0)

## 2015-03-22 LAB — COMPREHENSIVE METABOLIC PANEL
ALK PHOS: 48 U/L
ALT: 13 U/L — AB
AST: 20 U/L
Albumin: 4.4 g/dL
Anion Gap: 8 (ref 7–16)
BUN: 14 mg/dL
Bilirubin,Total: 0.7 mg/dL
CALCIUM: 8.7 mg/dL — AB
CO2: 25 mmol/L
Chloride: 107 mmol/L
Creatinine: 0.71 mg/dL
EGFR (African American): >60
EGFR (Non-African Amer.): >60
GLUCOSE: 125 mg/dL — AB
POTASSIUM: 3.7 mmol/L
SODIUM: 140 mmol/L
Total Protein: 7.3 g/dL

## 2015-03-22 LAB — TROPONIN I: Troponin-I: <0.03 ng/mL

## 2015-03-22 LAB — LIPASE, BLOOD: LIPASE: 25 U/L

## 2016-07-10 ENCOUNTER — Other Ambulatory Visit: Payer: Self-pay | Admitting: Obstetrics and Gynecology

## 2016-11-10 ENCOUNTER — Other Ambulatory Visit: Payer: Self-pay | Admitting: Obstetrics and Gynecology

## 2017-04-16 ENCOUNTER — Telehealth: Payer: Self-pay | Admitting: Obstetrics and Gynecology

## 2017-04-16 NOTE — Telephone Encounter (Signed)
Patient LVM requesting refill for citalopram

## 2017-04-17 ENCOUNTER — Other Ambulatory Visit: Payer: Self-pay | Admitting: *Deleted

## 2017-04-17 MED ORDER — CITALOPRAM HYDROBROMIDE 20 MG PO TABS: 20.0000 mg | ORAL_TABLET | Freq: Every day | ORAL | 0 refills | Status: DC

## 2017-04-17 NOTE — Telephone Encounter (Signed)
Done-ac pt must make appt before any other refills

## 2017-05-27 ENCOUNTER — Telehealth: Payer: Self-pay | Admitting: Obstetrics and Gynecology

## 2017-05-27 NOTE — Telephone Encounter (Signed)
Patient called to request a refill on Caroline Miles

## 2017-05-28 ENCOUNTER — Other Ambulatory Visit: Payer: Self-pay | Admitting: *Deleted

## 2017-05-28 MED ORDER — CITALOPRAM HYDROBROMIDE 20 MG PO TABS: 20.0000 mg | ORAL_TABLET | Freq: Every day | ORAL | 0 refills | Status: DC

## 2017-05-28 NOTE — Telephone Encounter (Signed)
Done-ac 

## 2017-06-13 ENCOUNTER — Encounter: Payer: Self-pay | Admitting: Obstetrics and Gynecology

## 2017-06-14 ENCOUNTER — Encounter: Payer: Self-pay | Admitting: Obstetrics and Gynecology

## 2017-06-14 ENCOUNTER — Ambulatory Visit (INDEPENDENT_AMBULATORY_CARE_PROVIDER_SITE_OTHER): Payer: BLUE CROSS/BLUE SHIELD | Admitting: Obstetrics and Gynecology

## 2017-06-14 VITALS — BP 124/75 | HR 65 | Ht 65.0 in | Wt 152.2 lb

## 2017-06-14 DIAGNOSIS — Z79899 Other long term (current) drug therapy: Secondary | ICD-10-CM | POA: Diagnosis not present

## 2017-06-14 DIAGNOSIS — Z0000 Encounter for general adult medical examination without abnormal findings: Secondary | ICD-10-CM | POA: Diagnosis not present

## 2017-06-14 MED ORDER — CITALOPRAM HYDROBROMIDE 10 MG PO TABS: 10.0000 mg | ORAL_TABLET | Freq: Every day | ORAL | 6 refills | Status: DC

## 2017-06-14 NOTE — Progress Notes (Signed)
Subjective:     Patient ID: Caroline Miles, female   DOB: November 05, 1966, 51 y.o.   MRN: [id number]  HPI Needs refill on Celexa.and needs AE and MMG. Tried stopping it a few months ago and felt very sick. Otherwise has been doing well.  Review of Systems Negative     Objective:   Physical Exam A&Ox4 Well groomed female in no distress Blood pressure 124/75, pulse 65, height 5\' 5"  (1.651 m), weight 152 lb 3.2 oz (69 kg), last menstrual period 06/11/2017. No PE indicated     Assessment:     Medication management Needs MMG    Plan:     Celexa refilled but decreased to 10mg  MMG ordered Already schedule AE in a month.  Melody [place], CNM

## 2017-07-15 ENCOUNTER — Ambulatory Visit
Admission: RE | Admit: 2017-07-15 | Discharge: 2017-07-15 | Disposition: A | Discharge status: Home / Self Care | Payer: BLUE CROSS/BLUE SHIELD | Source: Ambulatory Visit | Attending: Obstetrics and Gynecology | Admitting: Obstetrics and Gynecology

## 2017-07-15 DIAGNOSIS — Z1231 Encounter for screening mammogram for malignant neoplasm of breast: Secondary | ICD-10-CM | POA: Diagnosis present

## 2017-07-15 DIAGNOSIS — Z0000 Encounter for general adult medical examination without abnormal findings: Secondary | ICD-10-CM

## 2017-07-15 IMAGING — MG MM DIGITAL SCREENING BILAT W/ CAD
4 series · 4 of 4 positions shown · non-contrast
Comparison: Previous exam(s).

CLINICAL DATA: Screening.

EXAM:
DIGITAL SCREENING BILATERAL MAMMOGRAM WITH CAD

[R CC]
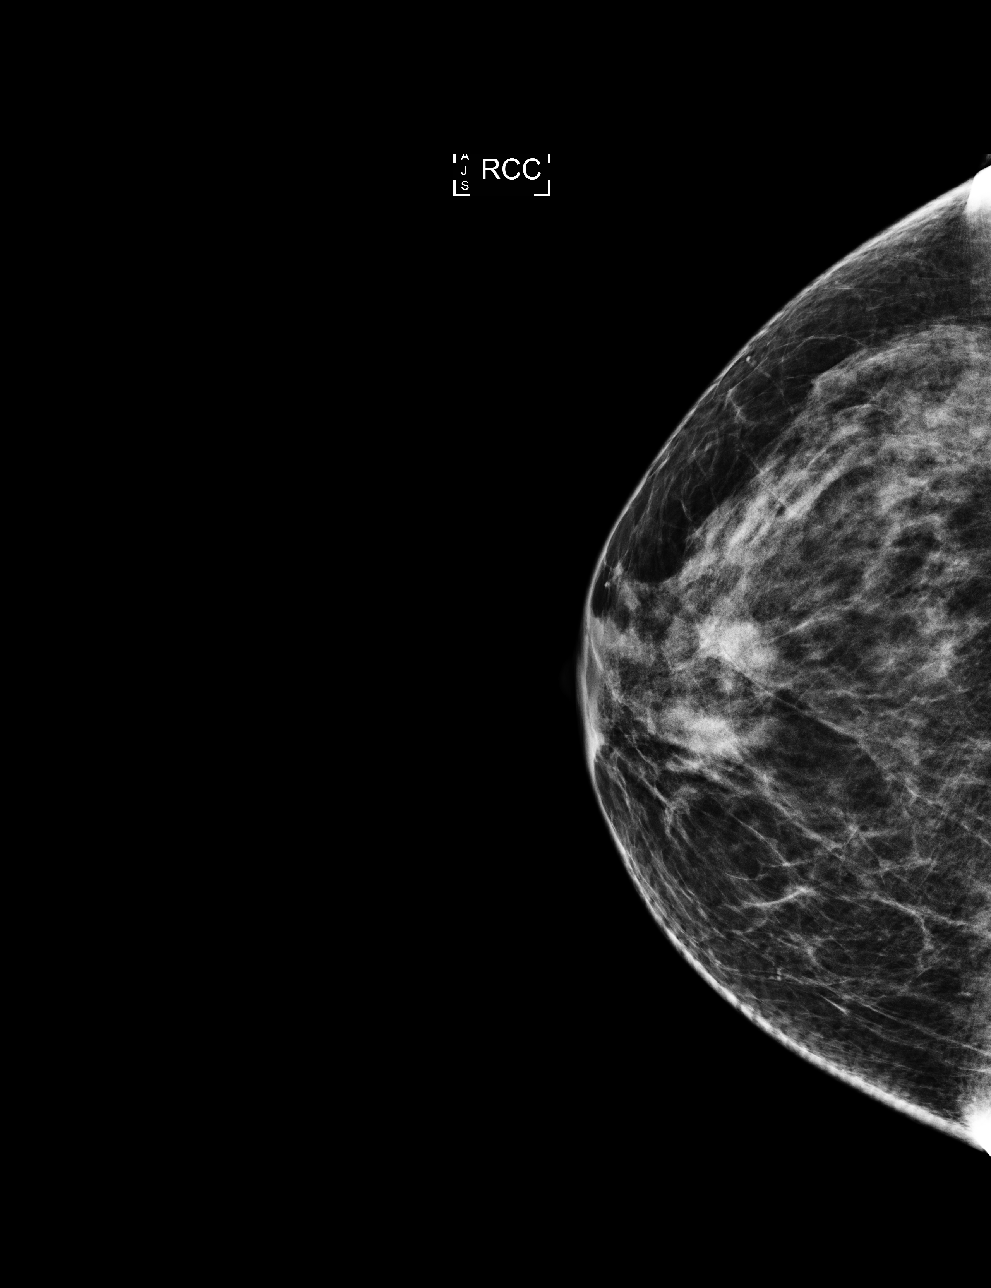

[L CC]
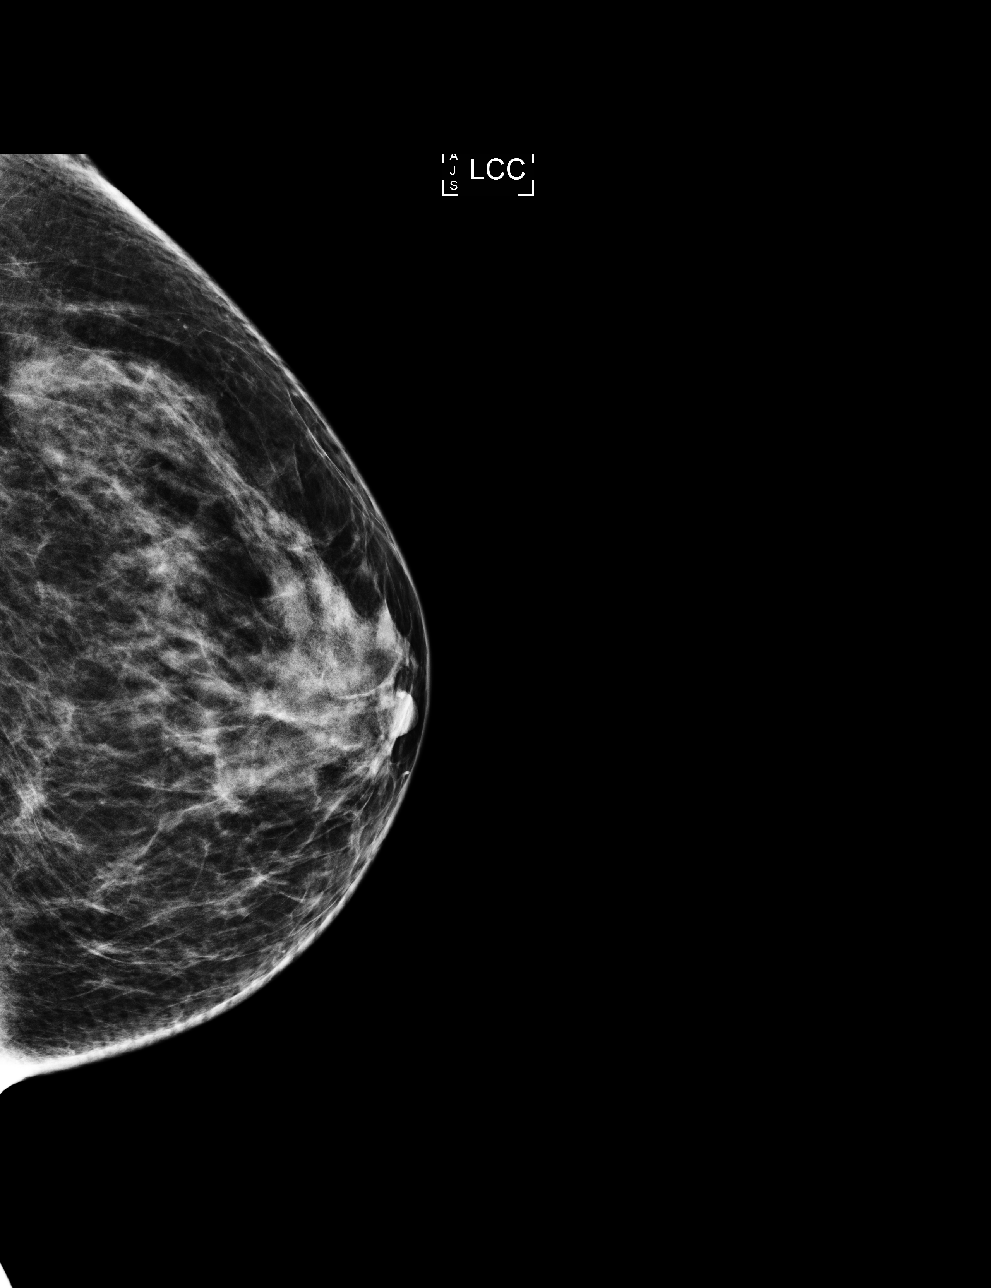

[R MLO]
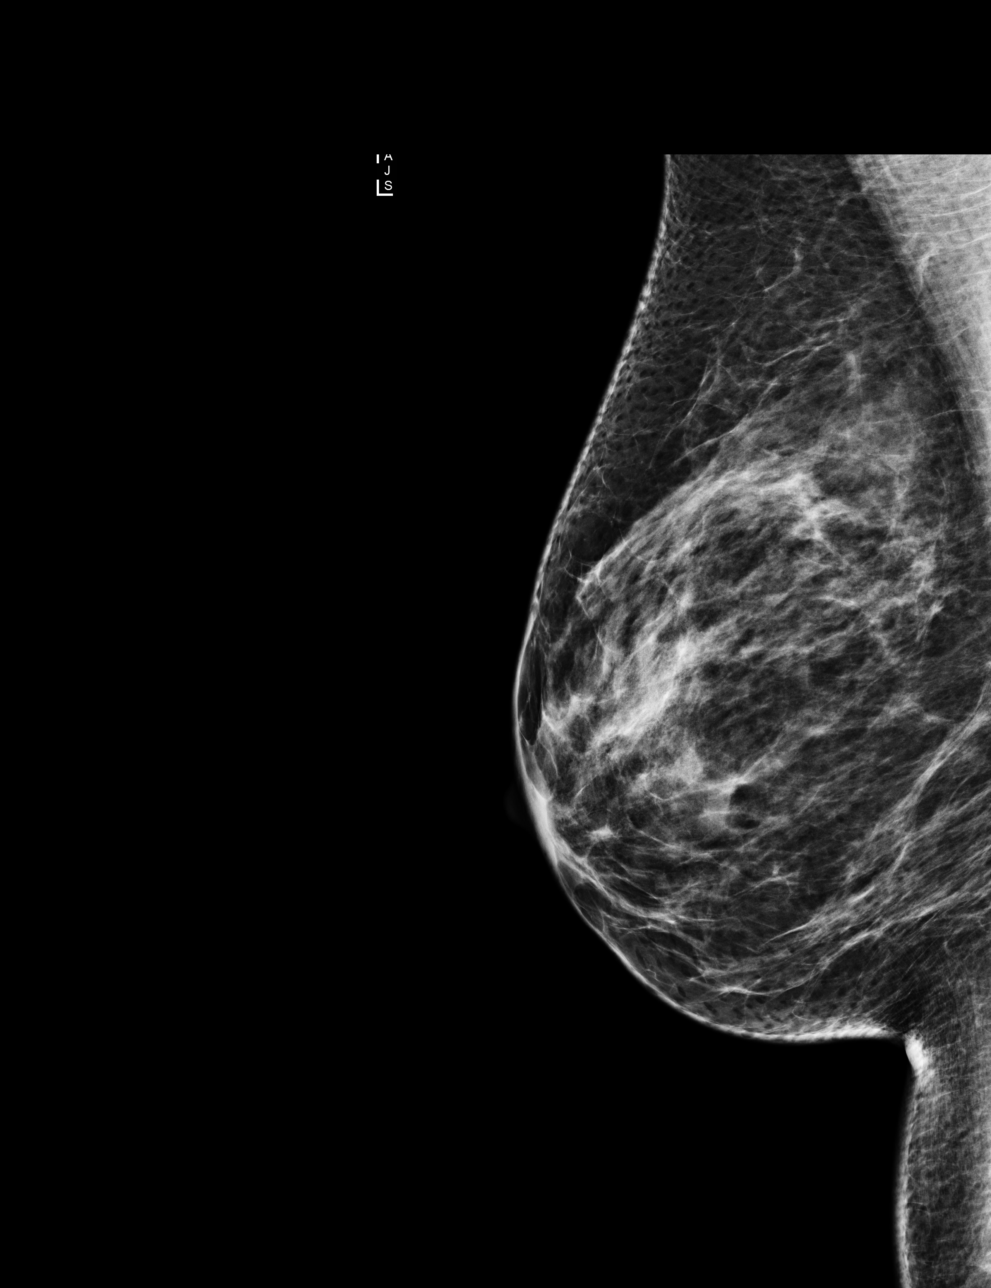

[L MLO]
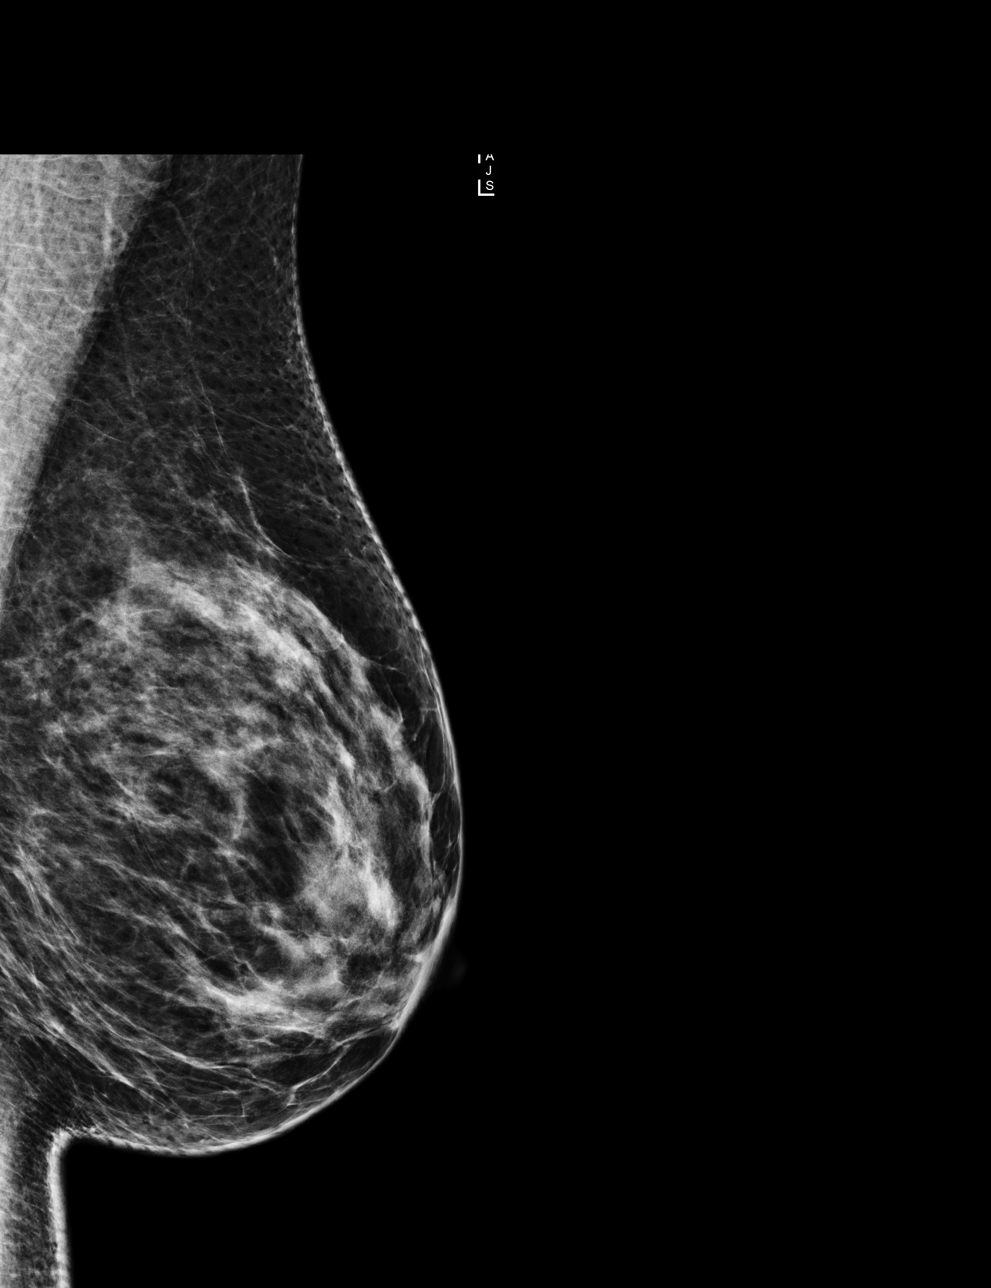

[4 of 4 positions shown; findings below may reference images not displayed]

ACR Breast Density Category c: The breast tissue is heterogeneously
dense, which may obscure small masses.
FINDINGS: There are no findings suspicious for malignancy. Images were
processed with CAD.
IMPRESSION: No mammographic evidence of malignancy. A result letter of this
screening mammogram will be mailed directly to the patient.

RECOMMENDATION:
Screening mammogram in one year. (Code:[0J])

BI-RADS CATEGORY  1: Negative.

## 2017-08-22 ENCOUNTER — Encounter: Payer: Self-pay | Admitting: Obstetrics and Gynecology

## 2017-09-03 ENCOUNTER — Other Ambulatory Visit: Payer: Self-pay | Admitting: Obstetrics and Gynecology

## 2017-09-04 ENCOUNTER — Ambulatory Visit (INDEPENDENT_AMBULATORY_CARE_PROVIDER_SITE_OTHER): Payer: BLUE CROSS/BLUE SHIELD | Admitting: Obstetrics and Gynecology

## 2017-09-04 ENCOUNTER — Encounter: Payer: Self-pay | Admitting: Obstetrics and Gynecology

## 2017-09-04 VITALS — BP 121/70 | HR 76 | Ht 65.0 in | Wt 149.1 lb

## 2017-09-04 DIAGNOSIS — Z01419 Encounter for gynecological examination (general) (routine) without abnormal findings: Secondary | ICD-10-CM | POA: Diagnosis not present

## 2017-09-04 NOTE — Progress Notes (Signed)
Subjective:   Caroline Miles is a 51 y.o. G62P1 Asian female here for a routine well-woman exam.  No LMP recorded. Patient is not currently having periods (Reason: Other).    Current complaints: skipping menses, nipples itch,  PCP: me       Does need labs  Social History: Sexual: heterosexual Marital Status: single Living situation: with partner / significant other Occupation: FT at [employer]: no tobacco use Illicit drugs: no history of illicit drug use  The following portions of the patient's history were reviewed and updated as appropriate: allergies, current medications, past family history, past medical history, past social history, past surgical history and problem list.  Past Medical History Past Medical History:  Diagnosis Date  . Anxiety     Past Surgical History History reviewed. No pertinent surgical history.  Gynecologic History G1P1  No LMP recorded. Patient is not currently having periods (Reason: Other). Contraception: vasectomy Last Pap: ?Marland Kitchen Results were: normal Last mammogram: 06/2017. Results were: normal   Obstetric History OB History  Gravida Para Term Preterm AB Living  1 1          SAB TAB Ectopic Multiple Live Births               # Outcome Date GA Lbr Len/2nd Weight Sex Delivery Anes PTL Lv  1 Para 1997    F CS-Unspec         Current Medications Current Outpatient Prescriptions on File Prior to Visit  Medication Sig Dispense Refill  . citalopram (CELEXA) 10 MG tablet Take 1 tablet (10 mg total) by mouth daily. 30 tablet 6   No current facility-administered medications on file prior to visit.     Review of Systems Patient denies any headaches, blurred vision, shortness of breath, chest pain, abdominal pain, problems with bowel movements, urination, or intercourse.  Objective:  BP 121/70   Pulse 76   Ht 5\' 5"  (1.651 m)   Wt 149 lb 1.6 oz (67.6 kg)   BMI 24.81 kg/m  Physical Exam  General:  Well developed, well  nourished, no acute distress. She is alert and oriented x3. Skin:  Warm and dry Neck:  Midline trachea, no thyromegaly or nodules Cardiovascular: Regular rate and rhythm, no murmur heard Lungs:  Effort normal, all lung fields clear to auscultation bilaterally Breasts:  No dominant palpable mass, retraction, or nipple discharge Abdomen:  Soft, non tender, no hepatosplenomegaly or masses Pelvic:  External genitalia is normal in appearance.  The vagina is normal in appearance. The cervix is bulbous, no CMT.  Thin prep pap is done with HR HPV cotesting. Uterus is felt to be normal size, shape, and contour.  No adnexal masses or tenderness noted. Extremities:  No swelling or varicosities noted Psych:  She has a normal mood and affect  Assessment:   Healthy well-woman exam  Plan:  Labs obtained and will follow up accordingly. F/U 1 year for AE, or sooner if needed   Omauri Boeve Rockney Ghee, CNM

## 2017-09-05 LAB — CBC
HEMATOCRIT: 35.3 % (ref 34.0–46.6)
Hemoglobin: 11.6 g/dL (ref 11.1–15.9)
MCH: 27.3 pg (ref 26.6–33.0)
MCHC: 32.9 g/dL (ref 31.5–35.7)
MCV: 83 fL (ref 79–97)
PLATELETS: 295 10*3/uL (ref 150–379)
RBC: 4.25 x10E6/uL (ref 3.77–5.28)
RDW: 14.9 % (ref 12.3–15.4)
WBC: 5.7 10*3/uL (ref 3.4–10.8)

## 2017-09-05 LAB — COMPREHENSIVE METABOLIC PANEL
A/G RATIO: 1.9 (ref 1.2–2.2)
ALBUMIN: 4.5 g/dL (ref 3.5–5.5)
ALT: 20 IU/L (ref 0–32)
AST: 19 IU/L (ref 0–40)
Alkaline Phosphatase: 56 IU/L (ref 39–117)
BUN / CREAT RATIO: 14 (ref 9–23)
BUN: 12 mg/dL (ref 6–24)
Bilirubin Total: 0.4 mg/dL (ref 0.0–1.2)
CALCIUM: 9.1 mg/dL (ref 8.7–10.2)
CO2: 23 mmol/L (ref 20–29)
Chloride: 103 mmol/L (ref 96–106)
Creatinine, Ser: 0.86 mg/dL (ref 0.57–1.00)
GFR, EST AFRICAN AMERICAN: 92 mL/min/{1.73_m2} (ref >59)
GFR, EST NON AFRICAN AMERICAN: 80 mL/min/{1.73_m2} (ref >59)
GLOBULIN, TOTAL: 2.4 g/dL (ref 1.5–4.5)
Glucose: 93 mg/dL (ref 65–99)
POTASSIUM: 4.9 mmol/L (ref 3.5–5.2)
Sodium: 141 mmol/L (ref 134–144)
TOTAL PROTEIN: 6.9 g/dL (ref 6.0–8.5)

## 2017-09-05 LAB — LIPID PANEL
Chol/HDL Ratio: 2.9 ratio (ref 0.0–4.4)
Cholesterol, Total: 142 mg/dL (ref 100–199)
HDL: 49 mg/dL (ref >39)
LDL Calculated: 83 mg/dL (ref 0–99)
TRIGLYCERIDES: 51 mg/dL (ref 0–149)
VLDL Cholesterol Cal: 10 mg/dL (ref 5–40)

## 2017-09-05 LAB — HEMOGLOBIN A1C
Est. average glucose Bld gHb Est-mCnc: 123 mg/dL
Hgb A1c MFr Bld: 5.9 % — ABNORMAL HIGH (ref 4.8–5.6)

## 2017-09-05 LAB — VITAMIN D 25 HYDROXY (VIT D DEFICIENCY, FRACTURES): VIT D 25 HYDROXY: 33.1 ng/mL (ref 30.0–100.0)

## 2017-09-09 LAB — CYTOLOGY - PAP

## 2017-09-16 ENCOUNTER — Other Ambulatory Visit: Payer: Self-pay | Admitting: Obstetrics and Gynecology

## 2017-09-16 DIAGNOSIS — R7303 Prediabetes: Secondary | ICD-10-CM

## 2017-09-16 HISTORY — DX: Prediabetes: R73.03

## 2017-09-17 ENCOUNTER — Telehealth: Payer: Self-pay | Admitting: *Deleted

## 2017-09-17 NOTE — Telephone Encounter (Signed)
Mailed all info to pt 

## 2017-09-17 NOTE — Telephone Encounter (Signed)
-----   Message from Joylene Igo, [place] sent at 09/16/2017  8:18 PM EDT ----- Please mail info on pre-diabetes

## 2018-03-05 ENCOUNTER — Other Ambulatory Visit: Payer: Self-pay | Admitting: *Deleted

## 2018-03-05 ENCOUNTER — Telehealth: Payer: Self-pay | Admitting: Obstetrics and Gynecology

## 2018-03-05 MED ORDER — CITALOPRAM HYDROBROMIDE 20 MG PO TABS: 20.0000 mg | ORAL_TABLET | Freq: Every day | ORAL | 4 refills | Status: DC

## 2018-03-05 NOTE — Telephone Encounter (Signed)
Patient called stating she needs to up the dosage of celexa back to what it used to be. Patient states the 10 mg is not working. Thanks

## 2018-03-05 NOTE — Telephone Encounter (Signed)
Done-ac 

## 2018-09-09 ENCOUNTER — Encounter: Payer: Self-pay | Admitting: Obstetrics and Gynecology

## 2018-09-09 ENCOUNTER — Ambulatory Visit (INDEPENDENT_AMBULATORY_CARE_PROVIDER_SITE_OTHER): Payer: BLUE CROSS/BLUE SHIELD | Admitting: Obstetrics and Gynecology

## 2018-09-09 VITALS — BP 140/75 | HR 63 | Ht 66.0 in | Wt 143.8 lb

## 2018-09-09 DIAGNOSIS — Z01419 Encounter for gynecological examination (general) (routine) without abnormal findings: Secondary | ICD-10-CM

## 2018-09-09 MED ORDER — CITALOPRAM HYDROBROMIDE 20 MG PO TABS: 20.0000 mg | ORAL_TABLET | Freq: Every day | ORAL | 4 refills | Status: DC

## 2018-09-09 NOTE — Progress Notes (Signed)
Subjective:   Caroline Miles is a 52 y.o. G17P1 Caucasian female here for a routine well-woman exam.  No LMP recorded. (Menstrual status: Other).    Current complaints: no menses in almost a year. New female partner for the last 4 months. Is dancing in [employer] benefit next month, exercising reguarly.  PCP: me       does desire labs  Social History: Sexual: heterosexual Marital Status: divorced Living situation: alone Occupation: unknown occupation Tobacco/alcohol: no tobacco use Illicit drugs: no history of illicit drug use  The following portions of the patient's history were reviewed and updated as appropriate: allergies, current medications, past family history, past medical history, past social history, past surgical history and problem list.  Past Medical History Past Medical History:  Diagnosis Date  . Anxiety     Past Surgical History History reviewed. No pertinent surgical history.  Gynecologic History G1P1  No LMP recorded. (Menstrual status: Other). Contraception: vasectomy Last Pap: 2018. Results were: normal Last mammogram: 06/2017. Results were: normal   Obstetric History OB History  Gravida Para Term Preterm AB Living  1 1          SAB TAB Ectopic Multiple Live Births               # Outcome Date GA Lbr Len/2nd Weight Sex Delivery Anes PTL Lv  1 Para 1997    F CS-Unspec       Current Medications Current Outpatient Medications on File Prior to Visit  Medication Sig Dispense Refill  . citalopram (CELEXA) 20 MG tablet Take 1 tablet (20 mg total) by mouth daily. 30 tablet 4   No current facility-administered medications on file prior to visit.     Review of Systems Patient denies any headaches, blurred vision, shortness of breath, chest pain, abdominal pain, problems with bowel movements, urination, or intercourse.  Objective:  BP 140/75   Pulse 63   Ht 5\' 6"  (1.676 m)   Wt 143 lb 12.8 oz (65.2 kg)   BMI 23.21 kg/m   Physical Exam  General:  Well developed, well nourished, no acute distress. She is alert and oriented x3. Skin:  Warm and dry Neck:  Midline trachea, no thyromegaly or nodules Cardiovascular: Regular rate and rhythm, no murmur heard Lungs:  Effort normal, all lung fields clear to auscultation bilaterally Breasts:  No dominant palpable mass, retraction, or nipple discharge Abdomen:  Soft, non tender, no hepatosplenomegaly or masses Pelvic:  External genitalia is normal in appearance.  The vagina is normal in appearance. The cervix is bulbous, no CMT.  Thin prep pap is not done. Uterus is felt to be normal size, shape, and contour.  No adnexal masses or tenderness noted. Extremities:  No swelling or varicosities noted Psych:  She has a normal mood and affect  Assessment:   Healthy well-woman exam Pre-diabetes. Needs Flu Vaccines   Plan:  LABS obtained-will follow up accordingly Declines flu vaccine F/U 1 year for AE, or sooner if needed Mammogram ordered Colonoscopy due 2022  Jamilya Sarrazin Rockney Ghee, CNM

## 2018-09-10 ENCOUNTER — Telehealth: Payer: Self-pay | Admitting: *Deleted

## 2018-09-10 ENCOUNTER — Other Ambulatory Visit: Payer: Self-pay | Admitting: Obstetrics and Gynecology

## 2018-09-10 DIAGNOSIS — R7303 Prediabetes: Secondary | ICD-10-CM

## 2018-09-10 LAB — COMPREHENSIVE METABOLIC PANEL
A/G RATIO: 2.1 (ref 1.2–2.2)
ALK PHOS: 54 IU/L (ref 39–117)
ALT: 8 IU/L (ref 0–32)
AST: 16 IU/L (ref 0–40)
Albumin: 4.6 g/dL (ref 3.5–5.5)
BILIRUBIN TOTAL: 0.3 mg/dL (ref 0.0–1.2)
BUN / CREAT RATIO: 15 (ref 9–23)
BUN: 13 mg/dL (ref 6–24)
CHLORIDE: 107 mmol/L — AB (ref 96–106)
CO2: 24 mmol/L (ref 20–29)
Calcium: 9.2 mg/dL (ref 8.7–10.2)
Creatinine, Ser: 0.86 mg/dL (ref 0.57–1.00)
GFR calc non Af Amer: 79 mL/min/{1.73_m2} (ref >59)
GFR, EST AFRICAN AMERICAN: 91 mL/min/{1.73_m2} (ref >59)
GLOBULIN, TOTAL: 2.2 g/dL (ref 1.5–4.5)
Glucose: 90 mg/dL (ref 65–99)
Potassium: 4.4 mmol/L (ref 3.5–5.2)
SODIUM: 143 mmol/L (ref 134–144)
TOTAL PROTEIN: 6.8 g/dL (ref 6.0–8.5)

## 2018-09-10 LAB — HEMOGLOBIN A1C
Est. average glucose Bld gHb Est-mCnc: 128 mg/dL
HEMOGLOBIN A1C: 6.1 % — AB (ref 4.8–5.6)

## 2018-09-10 LAB — LIPID PANEL
CHOLESTEROL TOTAL: 160 mg/dL (ref 100–199)
Chol/HDL Ratio: 3 ratio (ref 0.0–4.4)
HDL: 54 mg/dL (ref >39)
LDL Calculated: 82 mg/dL (ref 0–99)
TRIGLYCERIDES: 122 mg/dL (ref 0–149)
VLDL Cholesterol Cal: 24 mg/dL (ref 5–40)

## 2018-09-10 NOTE — Telephone Encounter (Signed)
-----   Message from Joylene Igo, [place] sent at 09/10/2018  9:25 AM EDT ----- Please mail info on pre-diabetes, and let her know when nutritional consult is made ( in November please).

## 2018-09-10 NOTE — Telephone Encounter (Signed)
Mailed all info to pt 

## 2018-10-23 ENCOUNTER — Telehealth: Payer: Self-pay | Admitting: Obstetrics and Gynecology

## 2018-10-23 NOTE — Telephone Encounter (Signed)
The patient has a question about a prescription and is asking for Amy to call her, please advise, thanks.

## 2018-10-28 ENCOUNTER — Encounter: Payer: Self-pay | Admitting: *Deleted

## 2018-10-28 NOTE — Telephone Encounter (Signed)
The patient called again today asking to speak to her nurse about her prescription.  She said she is fine with her nurse sending a MyChart message as well, if needed.  Please advise, thanks.

## 2018-10-28 NOTE — Telephone Encounter (Signed)
Sent pt mychart message

## 2019-09-11 ENCOUNTER — Encounter: Payer: BLUE CROSS/BLUE SHIELD | Admitting: Obstetrics and Gynecology

## 2019-09-30 ENCOUNTER — Other Ambulatory Visit: Payer: Self-pay

## 2019-09-30 DIAGNOSIS — Z20822 Contact with and (suspected) exposure to covid-19: Secondary | ICD-10-CM

## 2019-10-01 ENCOUNTER — Encounter: Payer: BLUE CROSS/BLUE SHIELD | Admitting: Obstetrics and Gynecology

## 2019-10-02 ENCOUNTER — Telehealth: Payer: Self-pay | Admitting: General Practice

## 2019-10-02 LAB — SPECIMEN STATUS REPORT

## 2019-10-02 LAB — NOVEL CORONAVIRUS, NAA: SARS-CoV-2, NAA: NOT DETECTED

## 2019-10-02 NOTE — Telephone Encounter (Signed)
Negative COVID results given. Patient results "NOT Detected." Caller expressed understanding. ° °

## 2019-10-22 ENCOUNTER — Other Ambulatory Visit: Payer: Self-pay | Admitting: Obstetrics and Gynecology

## 2020-02-24 ENCOUNTER — Encounter: Payer: Self-pay | Admitting: Obstetrics and Gynecology

## 2020-05-06 ENCOUNTER — Encounter: Payer: BLUE CROSS/BLUE SHIELD | Admitting: Obstetrics and Gynecology

## 2020-05-11 ENCOUNTER — Ambulatory Visit (INDEPENDENT_AMBULATORY_CARE_PROVIDER_SITE_OTHER): Payer: 59 | Admitting: Obstetrics and Gynecology

## 2020-05-11 ENCOUNTER — Other Ambulatory Visit (HOSPITAL_COMMUNITY)
Admission: RE | Admit: 2020-05-11 | Discharge: 2020-05-11 | Disposition: A | Discharge status: Home / Self Care | Payer: 59 | Source: Ambulatory Visit | Attending: Obstetrics and Gynecology | Admitting: Obstetrics and Gynecology

## 2020-05-11 ENCOUNTER — Other Ambulatory Visit: Payer: Self-pay

## 2020-05-11 ENCOUNTER — Encounter: Payer: Self-pay | Admitting: Obstetrics and Gynecology

## 2020-05-11 VITALS — BP 136/78 | HR 67 | Ht 66.0 in | Wt 144.2 lb

## 2020-05-11 DIAGNOSIS — Z01419 Encounter for gynecological examination (general) (routine) without abnormal findings: Secondary | ICD-10-CM | POA: Diagnosis not present

## 2020-05-11 DIAGNOSIS — F419 Anxiety disorder, unspecified: Secondary | ICD-10-CM

## 2020-05-11 DIAGNOSIS — Z131 Encounter for screening for diabetes mellitus: Secondary | ICD-10-CM

## 2020-05-11 DIAGNOSIS — Z124 Encounter for screening for malignant neoplasm of cervix: Secondary | ICD-10-CM | POA: Diagnosis present

## 2020-05-11 DIAGNOSIS — Z1231 Encounter for screening mammogram for malignant neoplasm of breast: Secondary | ICD-10-CM | POA: Diagnosis not present

## 2020-05-11 DIAGNOSIS — R7303 Prediabetes: Secondary | ICD-10-CM

## 2020-05-11 LAB — HM PAP SMEAR: HM Pap smear: NORMAL

## 2020-05-11 NOTE — Patient Instructions (Signed)
Preventive Care 40-52 Years Old, Female Preventive care refers to visits with your health care provider and lifestyle choices that can promote health and wellness. This includes:  A yearly physical exam. This may also be called an annual well check.  Regular dental visits and eye exams.  Immunizations.  Screening for certain conditions.  Healthy lifestyle choices, such as eating a healthy diet, getting regular exercise, not using drugs or products that contain nicotine and tobacco, and limiting alcohol use. What can I expect for my preventive care visit? Physical exam Your health care provider will check your:  Height and weight. This may be used to calculate body mass index (BMI), which tells if you are at a healthy weight.  Heart rate and blood pressure.  Skin for abnormal spots. Counseling Your health care provider may ask you questions about your:  Alcohol, tobacco, and drug use.  Emotional well-being.  Home and relationship well-being.  Sexual activity.  Eating habits.  Work and work environment.  Method of birth control.  Menstrual cycle.  Pregnancy history. What immunizations do I need?  Influenza (flu) vaccine  This is recommended every year. Tetanus, diphtheria, and pertussis (Tdap) vaccine  You may need a Td booster every 10 years. Varicella (chickenpox) vaccine  You may need this if you have not been vaccinated. Zoster (shingles) vaccine  You may need this after age 60. Measles, mumps, and rubella (MMR) vaccine  You may need at least one dose of MMR if you were born in 1957 or later. You may also need a second dose. Pneumococcal conjugate (PCV13) vaccine  You may need this if you have certain conditions and were not previously vaccinated. Pneumococcal polysaccharide (PPSV23) vaccine  You may need one or two doses if you smoke cigarettes or if you have certain conditions. Meningococcal conjugate (MenACWY) vaccine  You may need this if you  have certain conditions. Hepatitis A vaccine  You may need this if you have certain conditions or if you travel or work in places where you may be exposed to hepatitis A. Hepatitis B vaccine  You may need this if you have certain conditions or if you travel or work in places where you may be exposed to hepatitis B. Haemophilus influenzae type b (Hib) vaccine  You may need this if you have certain conditions. Human papillomavirus (HPV) vaccine  If recommended by your health care provider, you may need three doses over 6 months. You may receive vaccines as individual doses or as more than one vaccine together in one shot (combination vaccines). Talk with your health care provider about the risks and benefits of combination vaccines. What tests do I need? Blood tests  Lipid and cholesterol levels. These may be checked every 5 years, or more frequently if you are over 52 years old.  Hepatitis C test.  Hepatitis B test. Screening  Lung cancer screening. You may have this screening every year starting at age 52 if you have a 30-pack-year history of smoking and currently smoke or have quit within the past 15 years.  Colorectal cancer screening. All adults should have this screening starting at age 52 and continuing until age 75. Your health care provider may recommend screening at age 52 if you are at increased risk. You will have tests every 1-10 years, depending on your results and the type of screening test.  Diabetes screening. This is done by checking your blood sugar (glucose) after you have not eaten for a while (fasting). You may have this   done every 1-3 years.  Mammogram. This may be done every 1-2 years. Talk with your health care provider about when you should start having regular mammograms. This may depend on whether you have a family history of breast cancer.  BRCA-related cancer screening. This may be done if you have a family history of breast, ovarian, tubal, or peritoneal  cancers.  Pelvic exam and Pap test. This may be done every 3 years starting at age 52. Starting at age 52, this may be done every 5 years if you have a Pap test in combination with an HPV test. Other tests  Sexually transmitted disease (STD) testing.  Bone density scan. This is done to screen for osteoporosis. You may have this scan if you are at high risk for osteoporosis. Follow these instructions at home: Eating and drinking  Eat a diet that includes fresh fruits and vegetables, whole grains, lean protein, and low-fat dairy.  Take vitamin and mineral supplements as recommended by your health care provider.  Do not drink alcohol if: ? Your health care provider tells you not to drink. ? You are pregnant, may be pregnant, or are planning to become pregnant.  If you drink alcohol: ? Limit how much you have to 0-1 drink a day. ? Be aware of how much alcohol is in your drink. In the [place] one drink equals one 12 oz bottle of beer (355 mL), one 5 oz glass of wine (148 mL), or one 1 oz glass of hard liquor (44 mL). Lifestyle  Take daily care of your teeth and gums.  Stay active. Exercise for at least 30 minutes on 5 or more days each week.  Do not use any products that contain nicotine or tobacco, such as cigarettes, e-cigarettes, and chewing tobacco. If you need help quitting, ask your health care provider.  If you are sexually active, practice safe sex. Use a condom or other form of birth control (contraception) in order to prevent pregnancy and STIs (sexually transmitted infections).  If told by your health care provider, take low-dose aspirin daily starting at age 52. What's next?  Visit your health care provider once a year for a well check visit.  Ask your health care provider how often you should have your eyes and teeth checked.  Stay up to date on all vaccines. This information is not intended to replace advice given to you by your health care provider. Make sure you  discuss any questions you have with your health care provider. Document Revised: 08/21/2018 Document Reviewed: 08/21/2018 Elsevier Patient Education  2020 [place] Breast self-awareness is knowing how your breasts look and feel. Doing breast self-awareness is important. It allows you to catch a breast problem early while it is still small and can be treated. All women should do breast self-awareness, including women who have had breast implants. Tell your doctor if you notice a change in your breasts. What you need:  A mirror.  A well-lit room. How to do a breast self-exam A breast self-exam is one way to learn what is normal for your breasts and to check for changes. To do a breast self-exam: Look for changes  1. Take off all the clothes above your waist. 2. Stand in front of a mirror in a room with good lighting. 3. Put your hands on your hips. 4. Push your hands down. 5. Look at your breasts and nipples in the mirror to see if one breast or nipple looks different from the  other. Check to see if: ? The shape of one breast is different. ? The size of one breast is different. ? There are wrinkles, dips, and bumps in one breast and not the other. 6. Look at each breast for changes in the skin, such as: ? Redness. ? Scaly areas. 7. Look for changes in your nipples, such as: ? Liquid around the nipples. ? Bleeding. ? Dimpling. ? Redness. ? A change in where the nipples are. Feel for changes  1. Lie on your back on the floor. 2. Feel each breast. To do this, follow these steps: ? Pick a breast to feel. ? Put the arm closest to that breast above your head. ? Use your other arm to feel the nipple area of your breast. Feel the area with the pads of your three middle fingers by making small circles with your fingers. For the first circle, press lightly. For the second circle, press harder. For the third circle, press even harder. ? Keep making circles with  your fingers at the different pressures as you move down your breast. Stop when you feel your ribs. ? Move your fingers a little toward the center of your body. ? Start making circles with your fingers again, this time going up until you reach your collarbone. ? Keep making up-and-down circles until you reach your armpit. Remember to keep using the three pressures. ? Feel the other breast in the same way. 3. Sit or stand in the tub or shower. 4. With soapy water on your skin, feel each breast the same way you did in step 2 when you were lying on the floor. Write down what you find Writing down what you find can help you remember what to tell your doctor. Write down:  What is normal for each breast.  Any changes you find in each breast, including: ? The kind of changes you find. ? Whether you have pain. ? Size and location of any lumps.  When you last had your menstrual period. General tips  Check your breasts every month.  If you are breastfeeding, the best time to check your breasts is after you feed your baby or after you use a breast pump.  If you get menstrual periods, the best time to check your breasts is 5-7 days after your menstrual period is over.  With time, you will become comfortable with the self-exam, and you will begin to know if there are changes in your breasts. Contact a doctor if you:  See a change in the shape or size of your breasts or nipples.  See a change in the skin of your breast or nipples, such as red or scaly skin.  Have fluid coming from your nipples that is not normal.  Find a lump or thick area that was not there before.  Have pain in your breasts.  Have any concerns about your breast health. Summary  Breast self-awareness includes looking for changes in your breasts, as well as feeling for changes within your breasts.  Breast self-awareness should be done in front of a mirror in a well-lit room.  You should check your breasts every month.  If you get menstrual periods, the best time to check your breasts is 5-7 days after your menstrual period is over.  Let your doctor know of any changes you see in your breasts, including changes in size, changes on the skin, pain or tenderness, or fluid from your nipples that is not normal. This information is not  intended to replace advice given to you by your health care provider. Make sure you discuss any questions you have with your health care provider. Document Revised: 07/29/2018 Document Reviewed: 07/29/2018 Elsevier Patient Education  [place].

## 2020-05-11 NOTE — Progress Notes (Signed)
ANNUAL PREVENTATIVE CARE GYNECOLOGY  ENCOUNTER NOTE  Subjective:       Caroline Miles is a 54 y.o. G1P1 postmenopausal female (x 1.5 years) here for a routine annual gynecologic exam. She is transitioning care from [place], [place]. The patient is sexually active. She eports that she recently got married in 2019.  Lost her insurance in 2020, and so was not able to return for her yearly physical. The patient is not taking hormone replacement therapy. Patient denies post-menopausal vaginal bleeding. The patient wears seatbelts: yes. The patient participates in regular exercise: no.   Current complaints: 1. None   Gynecologic History No LMP recorded (lmp unknown). Patient is postmenopausal. Contraception: post menopausal status and vasectomy Last Pap: 09/04/2017. Results were: normal Last mammogram: 07/15/2017. Results were: normal Last Colonoscopy:  Due in 2022 per previous provider.  Last Dexa Scan: Has never had one    Obstetric History OB History  Gravida Para Term Preterm AB Living  1 1          SAB TAB Ectopic Multiple Live Births               # Outcome Date GA Lbr Len/2nd Weight Sex Delivery Anes PTL Lv  1 Para 1997    F CS-Unspec       Past Medical History:  Diagnosis Date  . Anxiety     Family History  Problem Relation Age of Onset  . Diabetes Mother   . Hypertension Mother   . Stroke Mother   . Diabetes Father   . Prostate cancer Father     History reviewed. No pertinent surgical history.  Social History   Socioeconomic History  . Marital status: Married    Spouse name: Not on file  . Number of children: Not on file  . Years of education: Not on file  . Highest education level: Not on file  Occupational History  . Not on file  Tobacco Use  . Smoking status: Never Smoker  . Smokeless tobacco: Never Used  Substance and Sexual Activity  . Alcohol use: Yes    Comment: social  . Drug use: No  . Sexual activity: Yes  Other Topics Concern  . Not  on file  Social History Narrative  . Not on file   Social Determinants of Health   Financial Resource Strain:   . Difficulty of Paying Living Expenses:   Food Insecurity:   . Worried About Charity fundraiser in the Last Year:   . Arboriculturist in the Last Year:   Transportation Needs:   . Film/video editor (Medical):   Marland Kitchen Lack of Transportation (Non-Medical):   Physical Activity:   . Days of Exercise per Week:   . Minutes of Exercise per Session:   Stress:   . Feeling of Stress :   Social Connections:   . Frequency of Communication with Friends and Family:   . Frequency of Social Gatherings with Friends and Family:   . Attends Religious Services:   . Active Member of Clubs or Organizations:   . Attends Archivist Meetings:   Marland Kitchen Marital Status:   Intimate Partner Violence:   . Fear of Current or Ex-Partner:   . Emotionally Abused:   Marland Kitchen Physically Abused:   . Sexually Abused:     Current Outpatient Medications on File Prior to Visit  Medication Sig Dispense Refill  . citalopram (CELEXA) 20 MG tablet TAKE 1 TABLET BY MOUTH DAILY 90 tablet  4   No current facility-administered medications on file prior to visit.    No Known Allergies    Review of Systems ROS Review of Systems - General ROS: negative for - chills, fatigue, fever, hot flashes, night sweats, weight gain or weight loss Psychological ROS: negative for - anxiety, decreased libido, depression, mood swings, physical abuse or sexual abuse Ophthalmic ROS: negative for - blurry vision, eye pain or loss of vision ENT ROS: negative for - headaches, hearing change, visual changes or vocal changes Allergy and Immunology ROS: negative for - hives, itchy/watery eyes or seasonal allergies Hematological and Lymphatic ROS: negative for - bleeding problems, bruising, swollen lymph nodes or weight loss Endocrine ROS: negative for - galactorrhea, hair pattern changes, hot flashes, malaise/lethargy, mood swings,  palpitations, polydipsia/polyuria, skin changes, temperature intolerance or unexpected weight changes Breast ROS: negative for - new or changing breast lumps or nipple discharge Respiratory ROS: negative for - cough or shortness of breath Cardiovascular ROS: negative for - chest pain, irregular heartbeat, palpitations or shortness of breath Gastrointestinal ROS: no abdominal pain, change in bowel habits, or black or bloody stools Genito-Urinary ROS: no dysuria, trouble voiding, or hematuria Musculoskeletal ROS: negative for - joint pain or joint stiffness Neurological ROS: negative for - bowel and bladder control changes Dermatological ROS: negative for rash and skin lesion changes   Objective:   BP 136/78   Pulse 67   Ht '5\' 6"'  (1.676 m)   Wt 144 lb 3.2 oz (65.4 kg)   LMP  (LMP Unknown)   BMI 23.27 kg/m  CONSTITUTIONAL: Well-developed, well-nourished female in no acute distress.  PSYCHIATRIC: Normal mood and affect. Normal behavior. Normal judgment and thought content. [place]: Alert and oriented to person, place, and time. Normal muscle tone coordination. No cranial nerve deficit noted. HENT:  Normocephalic, atraumatic, External right and left ear normal. Oropharynx is clear and moist EYES: Conjunctivae and EOM are normal. Pupils are equal, round, and reactive to light. No scleral icterus.  NECK: Normal range of motion, supple, no masses.  Normal thyroid.  SKIN: Skin is warm and dry. No rash noted. Not diaphoretic. No erythema. No pallor. CARDIOVASCULAR: Normal heart rate noted, regular rhythm, no murmur. RESPIRATORY: Clear to auscultation bilaterally. Effort and breath sounds normal, no problems with respiration noted. BREASTS: Symmetric in size. No masses, skin changes, nipple drainage, or lymphadenopathy. ABDOMEN: Soft, normal bowel sounds, no distention noted.  No tenderness, rebound or guarding.  BLADDER: Normal PELVIC:  Bladder no bladder distension noted  Urethra: normal  appearing urethra with no masses, tenderness or lesions  Vulva: normal appearing vulva with no masses, tenderness or lesions  Vagina: normal appearing vagina with normal color and discharge, no lesions  Cervix: normal appearing cervix without discharge or lesions and cervical stenosis present  Uterus: uterus is normal size, shape, consistency and nontender  Adnexa: normal adnexa in size, nontender and no masses  RV: External Exam NormaI, No Rectal Masses and Normal Sphincter tone  MUSCULOSKELETAL: Normal range of motion. No tenderness.  No cyanosis, clubbing, or edema.  2+ distal pulses. LYMPHATIC: No Axillary, Supraclavicular, or Inguinal Adenopathy.   Labs: Lab Results  Component Value Date   WBC 5.7 09/04/2017   HGB 11.6 09/04/2017   HCT 35.3 09/04/2017   MCV 83 09/04/2017   PLT 295 09/04/2017    Lab Results  Component Value Date   CREATININE 0.86 09/09/2018   BUN 13 09/09/2018   NA 143 09/09/2018   K 4.4 09/09/2018   CL 107 (  H) 09/09/2018   CO2 24 09/09/2018    Lab Results  Component Value Date   ALT 8 09/09/2018   AST 16 09/09/2018   ALKPHOS 54 09/09/2018   BILITOT 0.3 09/09/2018    Lab Results  Component Value Date   CHOL 160 09/09/2018   HDL 54 09/09/2018   LDLCALC 82 09/09/2018   TRIG 122 09/09/2018   CHOLHDL 3.0 09/09/2018    No results found for: TSH  Lab Results  Component Value Date   HGBA1C 6.1 (H) 09/09/2018     Assessment:   Encounter for well woman exam with routine gynecological exam - Plan: CBC, Comp Met (CMET), Lipid panel, Hemoglobin A1c, TSH  Breast cancer screening by mammogram - Plan: MM 3D SCREEN BREAST BILATERAL  Screening for diabetes mellitus - Plan: Hemoglobin A1c  Cervical cancer screening - Plan: Cytology - PAP  Pre-diabetes  Anxiety  Plan:  - Pap: Pap Co Test performed.  - Mammogram: Ordered - Stool Guaiac Testing:  Not Ordered - Labs: TSH, Lipid panel, CBC, CMP, TSH and Hemoglobin A1C - Routine preventative  health maintenance measures emphasized: Exercise/Diet/Weight control, Tobacco Warnings, Alcohol/Substance use risks and Stress Management - H/o anxiety, patient notes she would like to try to discontinue her Celexa, but has had issues in the past with trying to discontinue. Last attempt was when she did not take medication for 5 days.  Notes that she felt sick during this time. Advised on slower weaning process, could split tablet in half x 2 weeks, then decrease dose again by half after 2 additional week, or could perform every other day dosing for 2 weeks, then try to wean further. Patient notes that she may try one of these methods.  - Prediabetes, currently working on diet and exercising to manage. Will recheck HgbA1c. - Return to [place], [place]  [facility]

## 2020-05-11 NOTE — Progress Notes (Signed)
Pt present for annual exam. Pt stated that she was doing well no problems.  

## 2020-05-12 LAB — COMPREHENSIVE METABOLIC PANEL
ALT: 18 IU/L (ref 0–32)
AST: 23 IU/L (ref 0–40)
Albumin/Globulin Ratio: 1.6 (ref 1.2–2.2)
Albumin: 4.5 g/dL (ref 3.8–4.9)
Alkaline Phosphatase: 78 IU/L (ref 48–121)
BUN/Creatinine Ratio: 25 — ABNORMAL HIGH (ref 9–23)
BUN: 23 mg/dL (ref 6–24)
Bilirubin Total: <0.2 mg/dL (ref 0.0–1.2)
CO2: 25 mmol/L (ref 20–29)
Calcium: 9.6 mg/dL (ref 8.7–10.2)
Chloride: 102 mmol/L (ref 96–106)
Creatinine, Ser: 0.91 mg/dL (ref 0.57–1.00)
GFR calc Af Amer: 84 mL/min/{1.73_m2} (ref >59)
GFR calc non Af Amer: 73 mL/min/{1.73_m2} (ref >59)
Globulin, Total: 2.8 g/dL (ref 1.5–4.5)
Glucose: 87 mg/dL (ref 65–99)
Potassium: 4.8 mmol/L (ref 3.5–5.2)
Sodium: 139 mmol/L (ref 134–144)
Total Protein: 7.3 g/dL (ref 6.0–8.5)

## 2020-05-12 LAB — CBC
Hematocrit: 38 % (ref 34.0–46.6)
Hemoglobin: 12.4 g/dL (ref 11.1–15.9)
MCH: 28.1 pg (ref 26.6–33.0)
MCHC: 32.6 g/dL (ref 31.5–35.7)
MCV: 86 fL (ref 79–97)
Platelets: 288 10*3/uL (ref 150–450)
RBC: 4.41 x10E6/uL (ref 3.77–5.28)
RDW: 13.3 % (ref 11.7–15.4)
WBC: 5.7 10*3/uL (ref 3.4–10.8)

## 2020-05-12 LAB — LIPID PANEL
Chol/HDL Ratio: 3.1 ratio (ref 0.0–4.4)
Cholesterol, Total: 156 mg/dL (ref 100–199)
HDL: 51 mg/dL (ref >39)
LDL Chol Calc (NIH): 91 mg/dL (ref 0–99)
Triglycerides: 69 mg/dL (ref 0–149)
VLDL Cholesterol Cal: 14 mg/dL (ref 5–40)

## 2020-05-12 LAB — TSH: TSH: 2.66 u[IU]/mL (ref 0.450–4.500)

## 2020-05-12 LAB — HEMOGLOBIN A1C
Est. average glucose Bld gHb Est-mCnc: 123 mg/dL
Hgb A1c MFr Bld: 5.9 % — ABNORMAL HIGH (ref 4.8–5.6)

## 2020-05-14 ENCOUNTER — Encounter: Payer: Self-pay | Admitting: Obstetrics and Gynecology

## 2020-05-17 LAB — CYTOLOGY - PAP
Comment: NEGATIVE
Diagnosis: NEGATIVE
High risk HPV: NEGATIVE

## 2020-09-07 ENCOUNTER — Other Ambulatory Visit: Payer: BLUE CROSS/BLUE SHIELD

## 2020-09-07 ENCOUNTER — Other Ambulatory Visit: Payer: Self-pay

## 2020-09-07 DIAGNOSIS — Z20822 Contact with and (suspected) exposure to covid-19: Secondary | ICD-10-CM

## 2020-09-08 LAB — NOVEL CORONAVIRUS, NAA: SARS-CoV-2, NAA: NOT DETECTED

## 2020-09-08 LAB — SARS-COV-2, NAA 2 DAY TAT

## 2020-12-21 ENCOUNTER — Other Ambulatory Visit: Payer: Self-pay

## 2020-12-21 MED ORDER — CITALOPRAM HYDROBROMIDE 20 MG PO TABS: 20.0000 mg | ORAL_TABLET | Freq: Every day | ORAL | 1 refills | Status: DC

## 2021-04-24 ENCOUNTER — Telehealth: Payer: Self-pay | Admitting: Internal Medicine

## 2021-04-24 NOTE — Telephone Encounter (Signed)
Dr.Tullo will take as new patient

## 2021-05-16 ENCOUNTER — Encounter: Payer: Self-pay | Admitting: Obstetrics and Gynecology

## 2021-05-16 ENCOUNTER — Other Ambulatory Visit: Payer: Self-pay

## 2021-05-16 ENCOUNTER — Ambulatory Visit (INDEPENDENT_AMBULATORY_CARE_PROVIDER_SITE_OTHER): Payer: 59 | Admitting: Obstetrics and Gynecology

## 2021-05-16 VITALS — BP 138/87 | HR 68 | Ht 66.0 in | Wt 154.6 lb

## 2021-05-16 DIAGNOSIS — Z2821 Immunization not carried out because of patient refusal: Secondary | ICD-10-CM | POA: Insufficient documentation

## 2021-05-16 DIAGNOSIS — Z1211 Encounter for screening for malignant neoplasm of colon: Secondary | ICD-10-CM

## 2021-05-16 DIAGNOSIS — R7303 Prediabetes: Secondary | ICD-10-CM | POA: Diagnosis not present

## 2021-05-16 DIAGNOSIS — Z01419 Encounter for gynecological examination (general) (routine) without abnormal findings: Secondary | ICD-10-CM

## 2021-05-16 DIAGNOSIS — Z1231 Encounter for screening mammogram for malignant neoplasm of breast: Secondary | ICD-10-CM

## 2021-05-16 NOTE — Patient Instructions (Signed)
Preventive Care 84-53 Years Old, Female Preventive care refers to lifestyle choices and visits with your health care provider that can promote health and wellness. This includes:  A yearly physical exam. This is also called an annual wellness visit.  Regular dental and eye exams.  Immunizations.  Screening for certain conditions.  Healthy lifestyle choices, such as: ? Eating a healthy diet. ? Getting regular exercise. ? Not using drugs or products that contain nicotine and tobacco. ? Limiting alcohol use. What can I expect for my preventive care visit? Physical exam Your health care provider will check your:  Height and weight. These may be used to calculate your BMI (body mass index). BMI is a measurement that tells if you are at a healthy weight.  Heart rate and blood pressure.  Body temperature.  Skin for abnormal spots. Counseling Your health care provider may ask you questions about your:  Past medical problems.  Family's medical history.  Alcohol, tobacco, and drug use.  Emotional well-being.  Home life and relationship well-being.  Sexual activity.  Diet, exercise, and sleep habits.  Work and work Statistician.  Access to firearms.  Method of birth control.  Menstrual cycle.  Pregnancy history. What immunizations do I need? Vaccines are usually given at various ages, according to a schedule. Your health care provider will recommend vaccines for you based on your age, medical history, and lifestyle or other factors, such as travel or where you work.   What tests do I need? Blood tests  Lipid and cholesterol levels. These may be checked every 5 years, or more often if you are over 3 years old.  Hepatitis C test.  Hepatitis B test. Screening  Lung cancer screening. You may have this screening every year starting at age 73 if you have a 30-pack-year history of smoking and currently smoke or have quit within the past 15 years.  Colorectal cancer  screening. ? All adults should have this screening starting at age 52 and continuing until age 17. ? Your health care provider may recommend screening at age 49 if you are at increased risk. ? You will have tests every 1-10 years, depending on your results and the type of screening test.  Diabetes screening. ? This is done by checking your blood sugar (glucose) after you have not eaten for a while (fasting). ? You may have this done every 1-3 years.  Mammogram. ? This may be done every 1-2 years. ? Talk with your health care provider about when you should start having regular mammograms. This may depend on whether you have a family history of breast cancer.  BRCA-related cancer screening. This may be done if you have a family history of breast, ovarian, tubal, or peritoneal cancers.  Pelvic exam and Pap test. ? This may be done every 3 years starting at age 10. ? Starting at age 11, this may be done every 5 years if you have a Pap test in combination with an HPV test. Other tests  STD (sexually transmitted disease) testing, if you are at risk.  Bone density scan. This is done to screen for osteoporosis. You may have this scan if you are at high risk for osteoporosis. Talk with your health care provider about your test results, treatment options, and if necessary, the need for more tests. Follow these instructions at home: Eating and drinking  Eat a diet that includes fresh fruits and vegetables, whole grains, lean protein, and low-fat dairy products.  Take vitamin and mineral supplements  as recommended by your health care provider.  Do not drink alcohol if: ? Your health care provider tells you not to drink. ? You are pregnant, may be pregnant, or are planning to become pregnant.  If you drink alcohol: ? Limit how much you have to 0-1 drink a day. ? Be aware of how much alcohol is in your drink. In the [place] one drink equals one 12 oz bottle of beer (355 mL), one 5 oz glass of  wine (148 mL), or one 1 oz glass of hard liquor (44 mL).   Lifestyle  Take daily care of your teeth and gums. Brush your teeth every morning and night with fluoride toothpaste. Floss one time each day.  Stay active. Exercise for at least 30 minutes 5 or more days each week.  Do not use any products that contain nicotine or tobacco, such as cigarettes, e-cigarettes, and chewing tobacco. If you need help quitting, ask your health care provider.  Do not use drugs.  If you are sexually active, practice safe sex. Use a condom or other form of protection to prevent STIs (sexually transmitted infections).  If you do not wish to become pregnant, use a form of birth control. If you plan to become pregnant, see your health care provider for a prepregnancy visit.  If told by your health care provider, take low-dose aspirin daily starting at age 75.  Find healthy ways to cope with stress, such as: ? Meditation, yoga, or listening to music. ? Journaling. ? Talking to a trusted person. ? Spending time with friends and family. Safety  Always wear your seat belt while driving or riding in a vehicle.  Do not drive: ? If you have been drinking alcohol. Do not ride with someone who has been drinking. ? When you are tired or distracted. ? While texting.  Wear a helmet and other protective equipment during sports activities.  If you have firearms in your house, make sure you follow all gun safety procedures. What's next?  Visit your health care provider once a year for an annual wellness visit.  Ask your health care provider how often you should have your eyes and teeth checked.  Stay up to date on all vaccines. This information is not intended to replace advice given to you by your health care provider. Make sure you discuss any questions you have with your health care provider. Document Revised: 09/13/2020 Document Reviewed: 08/21/2018 Elsevier Patient Education  2021 [place] Breast self-awareness is knowing how your breasts look and feel. Doing breast self-awareness is important. It allows you to catch a breast problem early while it is still small and can be treated. All women should do breast self-awareness, including women who have had breast implants. Tell your doctor if you notice a change in your breasts. What you need:  A mirror.  A well-lit room. How to do a breast self-exam A breast self-exam is one way to learn what is normal for your breasts and to check for changes. To do a breast self-exam: Look for changes 1. Take off all the clothes above your waist. 2. Stand in front of a mirror in a room with good lighting. 3. Put your hands on your hips. 4. Push your hands down. 5. Look at your breasts and nipples in the mirror to see if one breast or nipple looks different from the other. Check to see if: ? The shape of one breast is different. ? The size of  one breast is different. ? There are wrinkles, dips, and bumps in one breast and not the other. 6. Look at each breast for changes in the skin, such as: ? Redness. ? Scaly areas. 7. Look for changes in your nipples, such as: ? Liquid around the nipples. ? Bleeding. ? Dimpling. ? Redness. ? A change in where the nipples are.   Feel for changes 1. Lie on your back on the floor. 2. Feel each breast. To do this, follow these steps: ? Pick a breast to feel. ? Put the arm closest to that breast above your head. ? Use your other arm to feel the nipple area of your breast. Feel the area with the pads of your three middle fingers by making small circles with your fingers. For the first circle, press lightly. For the second circle, press harder. For the third circle, press even harder. ? Keep making circles with your fingers at the different pressures as you move down your breast. Stop when you feel your ribs. ? Move your fingers a little toward the center of your body. ? Start making  circles with your fingers again, this time going up until you reach your collarbone. ? Keep making up-and-down circles until you reach your armpit. Remember to keep using the three pressures. ? Feel the other breast in the same way. 3. Sit or stand in the tub or shower. 4. With soapy water on your skin, feel each breast the same way you did in step 2 when you were lying on the floor.   Write down what you find Writing down what you find can help you remember what to tell your doctor. Write down:  What is normal for each breast.  Any changes you find in each breast, including: ? The kind of changes you find. ? Whether you have pain. ? Size and location of any lumps.  When you last had your menstrual period. General tips  Check your breasts every month.  If you are breastfeeding, the best time to check your breasts is after you feed your baby or after you use a breast pump.  If you get menstrual periods, the best time to check your breasts is 5-7 days after your menstrual period is over.  With time, you will become comfortable with the self-exam, and you will begin to know if there are changes in your breasts. Contact a doctor if you:  See a change in the shape or size of your breasts or nipples.  See a change in the skin of your breast or nipples, such as red or scaly skin.  Have fluid coming from your nipples that is not normal.  Find a lump or thick area that was not there before.  Have pain in your breasts.  Have any concerns about your breast health. Summary  Breast self-awareness includes looking for changes in your breasts, as well as feeling for changes within your breasts.  Breast self-awareness should be done in front of a mirror in a well-lit room.  You should check your breasts every month. If you get menstrual periods, the best time to check your breasts is 5-7 days after your menstrual period is over.  Let your doctor know of any changes you see in your  breasts, including changes in size, changes on the skin, pain or tenderness, or fluid from your nipples that is not normal. This information is not intended to replace advice given to you by your health care provider. Make sure  you discuss any questions you have with your health care provider. Document Revised: 07/29/2018 Document Reviewed: 07/29/2018 Elsevier Patient Education  [place].

## 2021-05-16 NOTE — Progress Notes (Signed)
ANNUAL PREVENTATIVE CARE GYNECOLOGY  ENCOUNTER NOTE  Subjective:       Caroline Miles is a 55 y.o. G1P1 married postmenopausal female here for a routine annual gynecologic exam.  The patient is sexually active. The patient is not taking hormone replacement therapy. She denies post-menopausal vaginal bleeding. The patient wears seatbelts: yes. The patient participates in regular exercise: no.   Current complaints: 1. Reports concerns about weight gain over the past year. Has questions about weight loss medication resumption (used in the past with previous provider).  2. Reports that she did not get mammogram as recommended last year.     Gynecologic History No LMP recorded (lmp unknown). Patient is postmenopausal. Contraception: post menopausal status and vasectomy Last Pap: 09/04/2017. Results were: normal Last mammogram: 07/15/2017. Results were: normal Last Colonoscopy:  Due in 2022 per previous provider.  Last Dexa Scan: Has never had one   Obstetric History OB History  Gravida Para Term Preterm AB Living  1 1          SAB IAB Ectopic Multiple Live Births               # Outcome Date GA Lbr Len/2nd Weight Sex Delivery Anes PTL Lv  1 Para 1997    F CS-Unspec       Past Medical History:  Diagnosis Date  . Anxiety   . Pre-diabetes 09/16/2017    Family History  Problem Relation Age of Onset  . Diabetes Mother   . Hypertension Mother   . Stroke Mother   . Diabetes Father   . Prostate cancer Father     History reviewed. No pertinent surgical history.  Social History   Socioeconomic History  . Marital status: Married    Spouse name: Not on file  . Number of children: Not on file  . Years of education: Not on file  . Highest education level: Not on file  Occupational History  . Not on file  Tobacco Use  . Smoking status: Never Smoker  . Smokeless tobacco: Never Used  Vaping Use  . Vaping Use: Never used  Substance and Sexual Activity  . Alcohol use: Yes     Comment: social  . Drug use: No  . Sexual activity: Yes  Other Topics Concern  . Not on file  Social History Narrative  . Not on file   Social Determinants of Health   Financial Resource Strain: Not on file  Food Insecurity: Not on file  Transportation Needs: Not on file  Physical Activity: Not on file  Stress: Not on file  Social Connections: Not on file  Intimate Partner Violence: Not on file    Current Outpatient Medications on File Prior to Visit  Medication Sig Dispense Refill  . citalopram (CELEXA) 20 MG tablet Take 1 tablet (20 mg total) by mouth daily. 90 tablet 1   No current facility-administered medications on file prior to visit.    No Known Allergies    Review of Systems ROS Review of Systems - General ROS: negative for - chills, fatigue, fever, hot flashes, night sweats, weight gain or weight loss Psychological ROS: negative for - anxiety, decreased libido, depression, mood swings, physical abuse or sexual abuse Ophthalmic ROS: negative for - blurry vision, eye pain or loss of vision ENT ROS: negative for - headaches, hearing change, visual changes or vocal changes Allergy and Immunology ROS: negative for - hives, itchy/watery eyes or seasonal allergies Hematological and Lymphatic ROS: negative for - bleeding problems,  bruising, swollen lymph nodes or weight loss Endocrine ROS: negative for - galactorrhea, hair pattern changes, hot flashes, malaise/lethargy, mood swings, palpitations, polydipsia/polyuria, skin changes, temperature intolerance or unexpected weight changes Breast ROS: negative for - new or changing breast lumps or nipple discharge Respiratory ROS: negative for - cough or shortness of breath Cardiovascular ROS: negative for - chest pain, irregular heartbeat, palpitations or shortness of breath Gastrointestinal ROS: no abdominal pain, change in bowel habits, or black or bloody stools Genito-Urinary ROS: no dysuria, trouble voiding, or  hematuria Musculoskeletal ROS: negative for - joint pain or joint stiffness Neurological ROS: negative for - bowel and bladder control changes Dermatological ROS: negative for rash and skin lesion changes   Objective:   BP 138/87   Pulse 68   Ht 5\' 6"  (1.676 m)   Wt 154 lb 9.6 oz (70.1 kg)   LMP  (LMP Unknown)   BMI 24.95 kg/m .  CONSTITUTIONAL: Well-developed, well-nourished female in no acute distress.  PSYCHIATRIC: Normal mood and affect. Normal behavior. Normal judgment and thought content. [place]: Alert and oriented to person, place, and time. Normal muscle tone coordination. No cranial nerve deficit noted. HENT:  Normocephalic, atraumatic, External right and left ear normal. Oropharynx is clear and moist EYES: Conjunctivae and EOM are normal. Pupils are equal, round, and reactive to light. No scleral icterus.  NECK: Normal range of motion, supple, no masses.  Normal thyroid.  SKIN: Skin is warm and dry. No rash noted. Not diaphoretic. No erythema. No pallor. CARDIOVASCULAR: Normal heart rate noted, regular rhythm, no murmur. RESPIRATORY: Clear to auscultation bilaterally. Effort and breath sounds normal, no problems with respiration noted. BREASTS: Symmetric in size. No masses, skin changes, nipple drainage, or lymphadenopathy. ABDOMEN: Soft, normal bowel sounds, no distention noted.  No tenderness, rebound or guarding.  BLADDER: Normal PELVIC:  Bladder no bladder distension noted  Urethra: normal appearing urethra with no masses, tenderness or lesions  Vulva: normal appearing vulva with no masses, tenderness or lesions  Vagina: normal appearing vagina with normal color and discharge, no lesions  Cervix: normal appearing cervix without discharge or lesions and cervical stenosis present  Uterus: uterus is normal size, shape, consistency and nontender  Adnexa: normal adnexa in size, nontender and no masses  RV: External Exam NormaI, No Rectal Masses and Normal Sphincter tone   MUSCULOSKELETAL: Normal range of motion. No tenderness.  No cyanosis, clubbing, or edema.  2+ distal pulses. LYMPHATIC: No Axillary, Supraclavicular, or Inguinal Adenopathy.   Labs: Lab Results  Component Value Date   WBC 5.7 05/11/2020   HGB 12.4 05/11/2020   HCT 38.0 05/11/2020   MCV 86 05/11/2020   PLT 288 05/11/2020    Lab Results  Component Value Date   CREATININE 0.91 05/11/2020   BUN 23 05/11/2020   NA 139 05/11/2020   K 4.8 05/11/2020   CL 102 05/11/2020   CO2 25 05/11/2020    Lab Results  Component Value Date   ALT 18 05/11/2020   AST 23 05/11/2020   ALKPHOS 78 05/11/2020   BILITOT <0.2 05/11/2020    Lab Results  Component Value Date   CHOL 156 05/11/2020   HDL 51 05/11/2020   LDLCALC 91 05/11/2020   TRIG 69 05/11/2020   CHOLHDL 3.1 05/11/2020    Lab Results  Component Value Date   TSH 2.660 05/11/2020    Lab Results  Component Value Date   HGBA1C 5.9 (H) 05/11/2020    Assessment:   1. Encounter for well woman  exam with routine gynecological exam   2. Breast cancer screening by mammogram   3. Pre-diabetes   4. Colon cancer screening    Plan:  - Pap: Not needed.  Up to date.  - Mammogram: Ordered. Encouraged patient to schedule prior to leaving appointment today.  - Colon cancer screening:  colonoscopy ordered - Labs: TSH, Lipid panel, CBC, CMP, TSH and Hemoglobin A1C - Routine preventative health maintenance measures emphasized: Exercise/Diet/Weight control, Tobacco Warnings, Alcohol/Substance use risks and Stress Management.  Patient also mentions that she has tried the [employer] in the past which worked well for her. Reports desired weight is ~ 140 lbs. Discussed that her BMI was considered normal, she was not a candidate for prescription weight loss medications at this time. Review of chart notes no more than 10 lb weight gain since last visit last year. Advised that she could resume diet, also encouraged initiation of physical activity.   - Prediabetes, will continue to monitor. Will recheck HgbA1c.  - Return to [place], MD  [facility]

## 2021-05-16 NOTE — Progress Notes (Signed)
Pt present for annual exam. Pt stated that she was doing well. Pt is aware that she is due for a colonoscopy and unsure when she will schedule one. Mammogram order placed; pap smear up to date.

## 2021-05-17 LAB — COMPREHENSIVE METABOLIC PANEL
ALT: 14 IU/L (ref 0–32)
AST: 13 IU/L (ref 0–40)
Albumin/Globulin Ratio: 2.3 — ABNORMAL HIGH (ref 1.2–2.2)
Albumin: 5.1 g/dL — ABNORMAL HIGH (ref 3.8–4.9)
Alkaline Phosphatase: 82 IU/L (ref 44–121)
BUN/Creatinine Ratio: 17 (ref 9–23)
BUN: 15 mg/dL (ref 6–24)
Bilirubin Total: 0.3 mg/dL (ref 0.0–1.2)
CO2: 24 mmol/L (ref 20–29)
Calcium: 9.8 mg/dL (ref 8.7–10.2)
Chloride: 101 mmol/L (ref 96–106)
Creatinine, Ser: 0.86 mg/dL (ref 0.57–1.00)
Globulin, Total: 2.2 g/dL (ref 1.5–4.5)
Glucose: 80 mg/dL (ref 65–99)
Potassium: 4.7 mmol/L (ref 3.5–5.2)
Sodium: 140 mmol/L (ref 134–144)
Total Protein: 7.3 g/dL (ref 6.0–8.5)
eGFR: 81 mL/min/{1.73_m2} (ref >59)

## 2021-05-17 LAB — CBC
Hematocrit: 39 % (ref 34.0–46.6)
Hemoglobin: 12.9 g/dL (ref 11.1–15.9)
MCH: 29.7 pg (ref 26.6–33.0)
MCHC: 33.1 g/dL (ref 31.5–35.7)
MCV: 90 fL (ref 79–97)
Platelets: 314 10*3/uL (ref 150–450)
RBC: 4.35 x10E6/uL (ref 3.77–5.28)
RDW: 13.4 % (ref 11.7–15.4)
WBC: 4.8 10*3/uL (ref 3.4–10.8)

## 2021-05-19 LAB — HEMOGLOBIN A1C
Est. average glucose Bld gHb Est-mCnc: 128 mg/dL
Hgb A1c MFr Bld: 6.1 % — ABNORMAL HIGH (ref 4.8–5.6)

## 2021-05-19 LAB — SPECIMEN STATUS REPORT

## 2021-05-23 ENCOUNTER — Ambulatory Visit
Admission: RE | Admit: 2021-05-23 | Discharge: 2021-05-23 | Disposition: A | Discharge status: Home / Self Care | Payer: 59 | Source: Ambulatory Visit | Attending: Obstetrics and Gynecology | Admitting: Obstetrics and Gynecology

## 2021-05-23 ENCOUNTER — Other Ambulatory Visit: Payer: Self-pay

## 2021-05-23 DIAGNOSIS — Z1231 Encounter for screening mammogram for malignant neoplasm of breast: Secondary | ICD-10-CM | POA: Insufficient documentation

## 2021-05-23 IMAGING — MG MM DIGITAL SCREENING BILAT W/ TOMO AND CAD
8 series · 8 of 24 positions shown · non-contrast
Comparison: Previous exam(s).

CLINICAL DATA: Screening.

EXAM:
DIGITAL SCREENING BILATERAL MAMMOGRAM WITH TOMOSYNTHESIS AND CAD
TECHNIQUE: Bilateral screening digital craniocaudal and mediolateral oblique
mammograms were obtained. Bilateral screening digital breast
tomosynthesis was performed. The images were evaluated with
computer-aided detection.

[L MLO synth-2D]
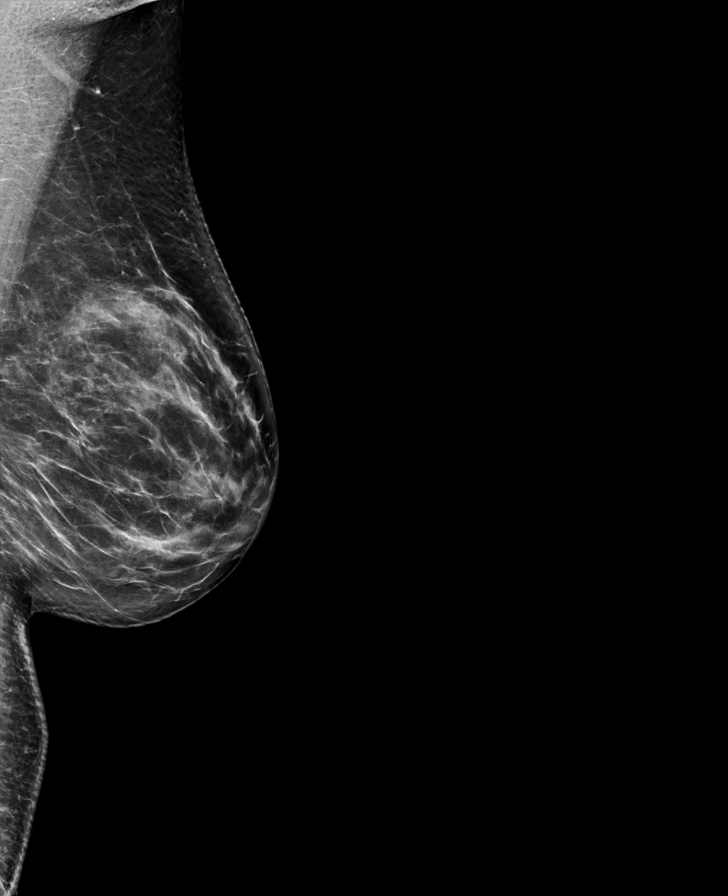

[R CC synth-2D]
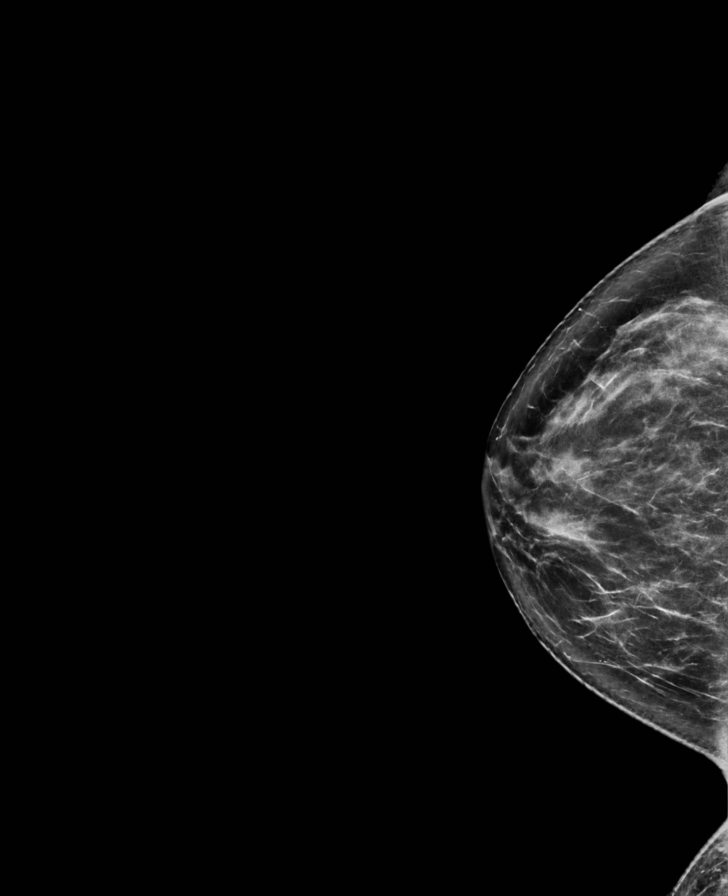

[L CC synth-2D]
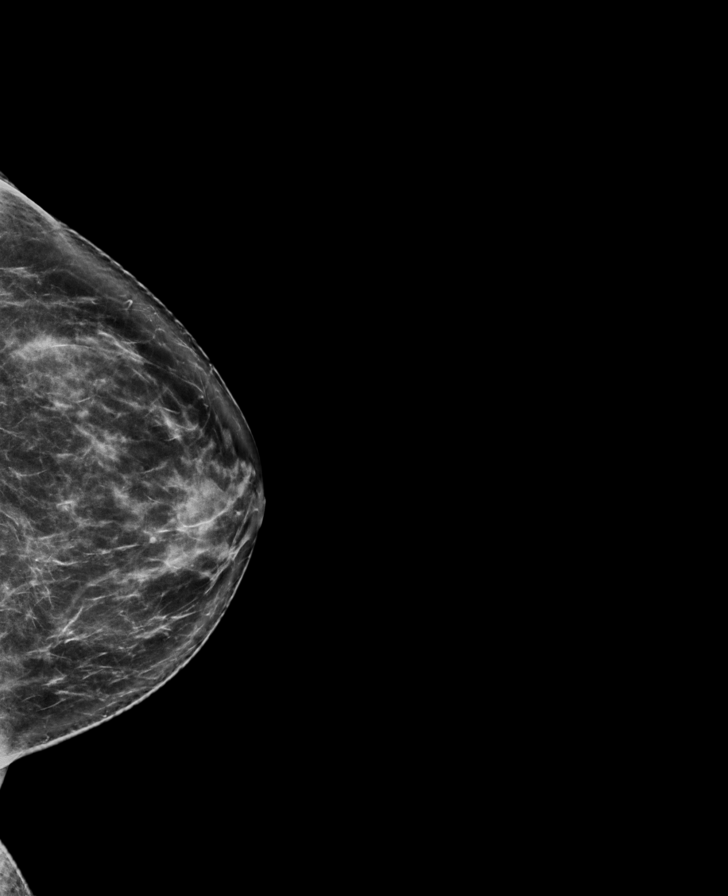

[R MLO synth-2D]
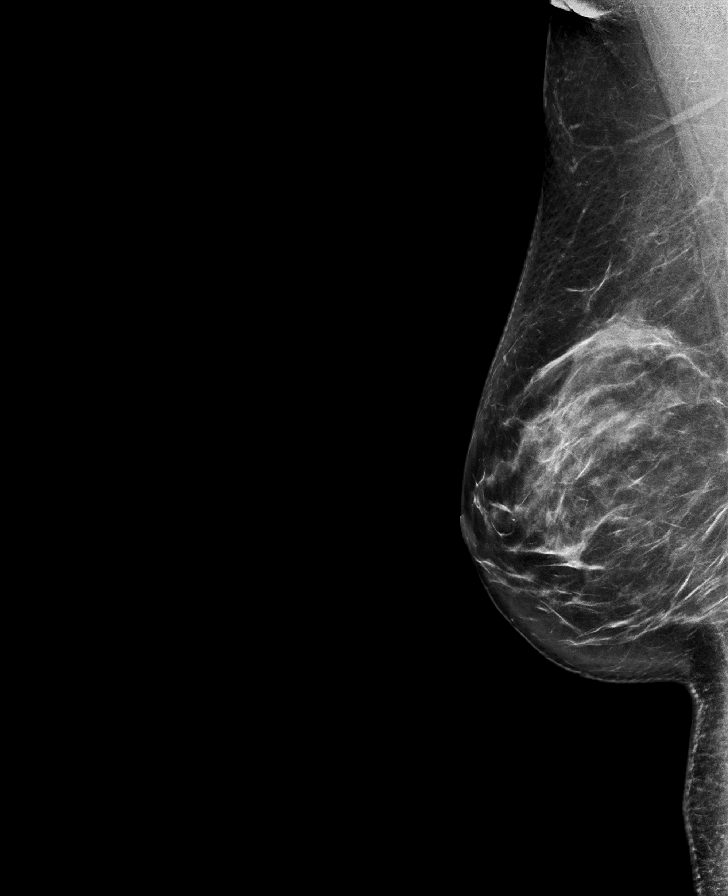

[R CC tomo · tomo slice 41/81.0]
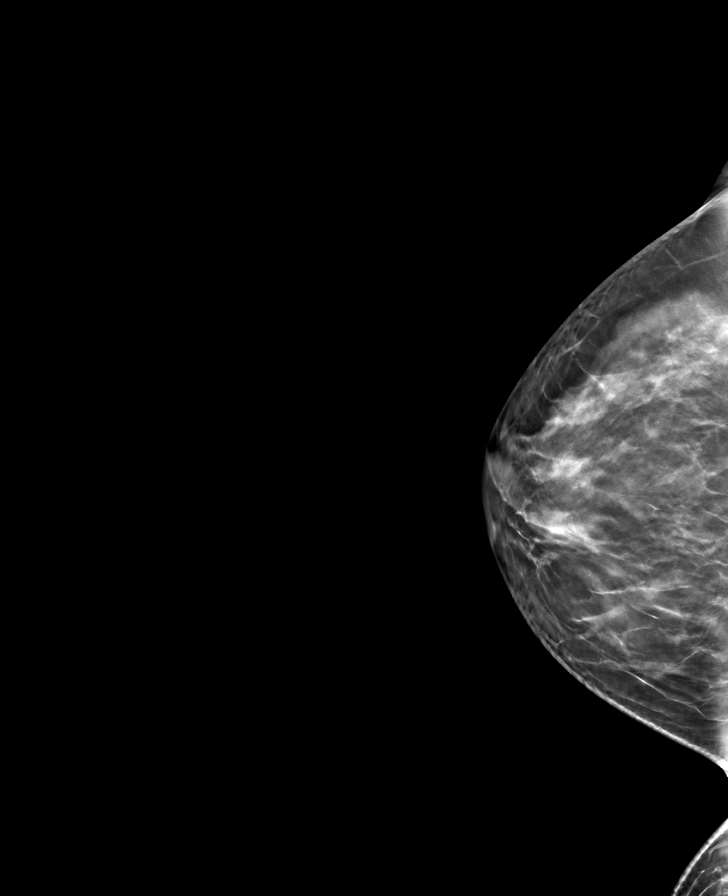

[R MLO tomo · tomo slice 45/89.0]
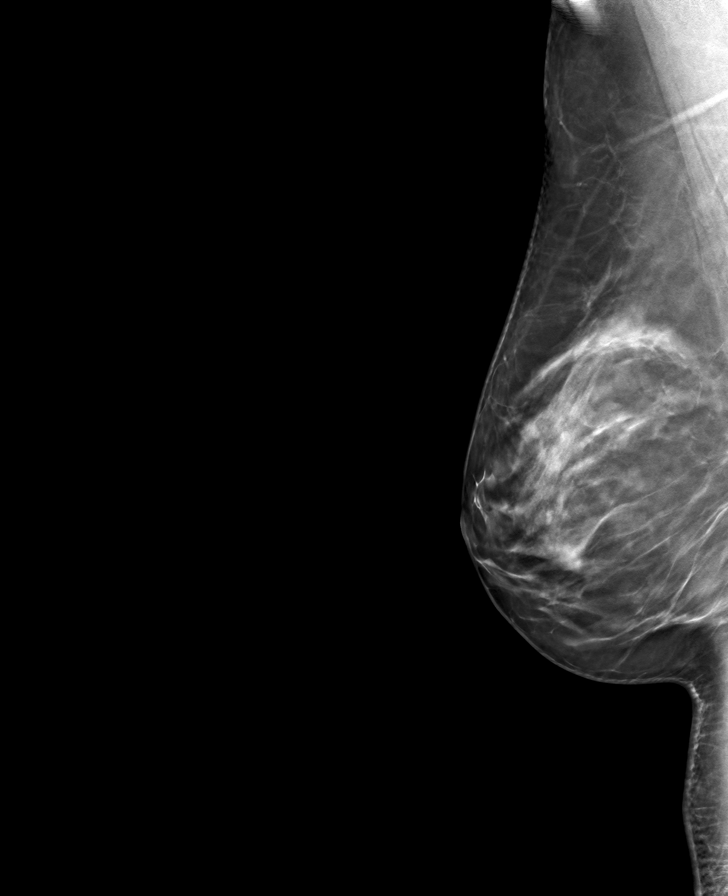

[L CC tomo · tomo slice 40/79.0]
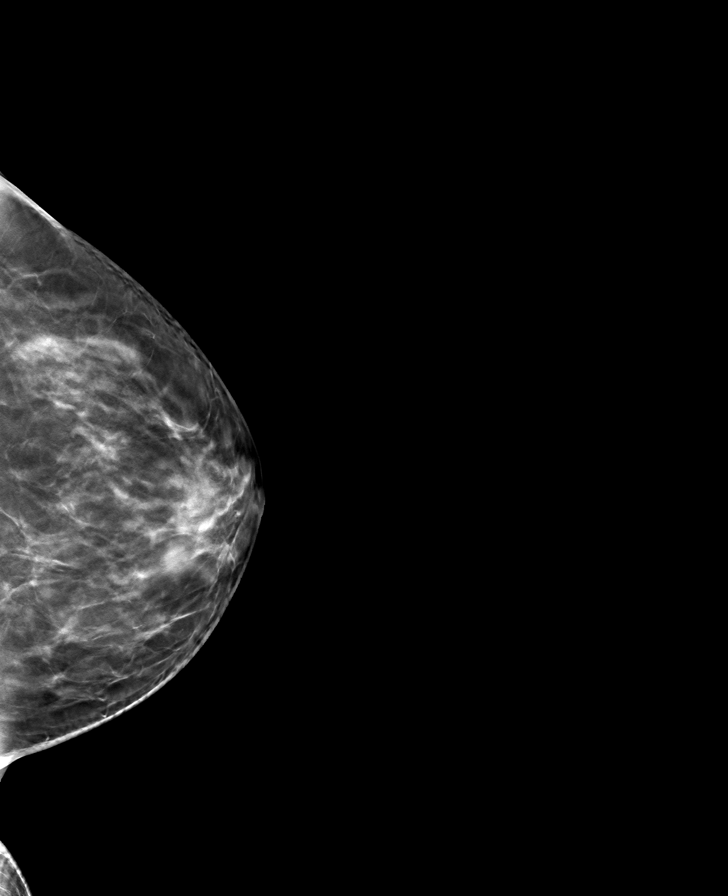

[L MLO tomo · tomo slice 43/84.0]
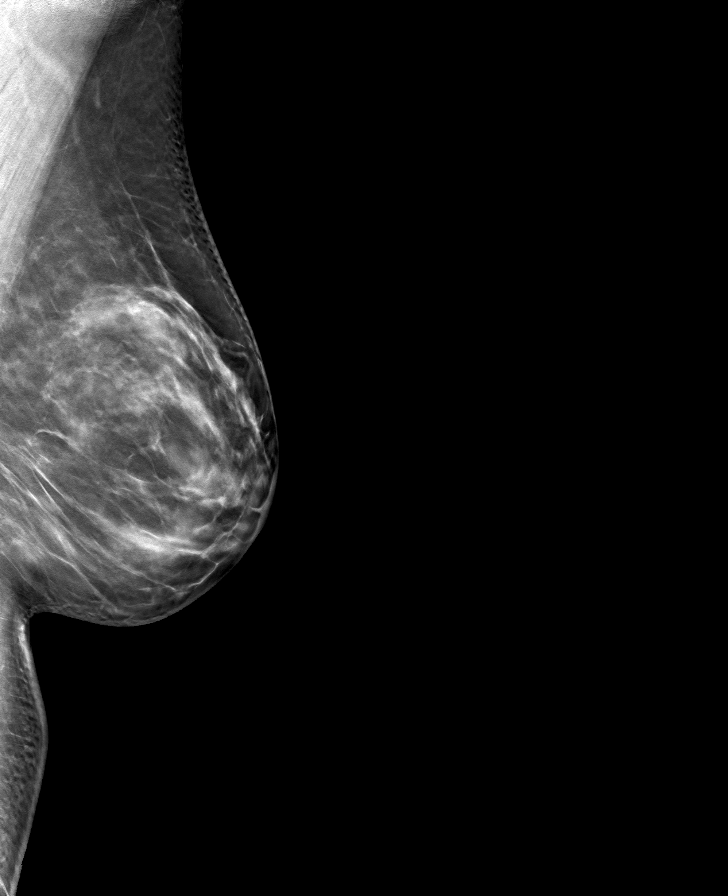

[8 of 24 positions shown; findings below may reference images not displayed]

ACR Breast Density Category c: The breast tissue is heterogeneously
dense, which may obscure small masses.
FINDINGS: There are no findings suspicious for malignancy. The images were
evaluated with computer-aided detection.
IMPRESSION: No mammographic evidence of malignancy. A result letter of this
screening mammogram will be mailed directly to the patient.

RECOMMENDATION:
Screening mammogram in one year. (Code:[6J])

BI-RADS CATEGORY  1: Negative.

## 2021-06-06 ENCOUNTER — Encounter: Payer: 59 | Admitting: Obstetrics and Gynecology

## 2021-06-28 ENCOUNTER — Telehealth: Payer: Self-pay

## 2021-06-28 ENCOUNTER — Telehealth (INDEPENDENT_AMBULATORY_CARE_PROVIDER_SITE_OTHER): Payer: 59 | Admitting: Gastroenterology

## 2021-06-28 ENCOUNTER — Other Ambulatory Visit: Payer: Self-pay

## 2021-06-28 DIAGNOSIS — Z1211 Encounter for screening for malignant neoplasm of colon: Secondary | ICD-10-CM

## 2021-06-28 MED ORDER — NA SULFATE-K SULFATE-MG SULF 17.5-3.13-1.6 GM/177ML PO SOLN: 1.0000 | Freq: Once | ORAL | 0 refills | Status: AC

## 2021-06-28 NOTE — Telephone Encounter (Signed)
Called patient twice to schedule colon cancer screening. LVM to call the office back.

## 2021-06-28 NOTE — Progress Notes (Signed)
Gastroenterology Pre-Procedure Review  Request Date: 07/06/21 Requesting Physician: Dr. Vicente Males  PATIENT REVIEW QUESTIONS: The patient responded to the following health history questions as indicated:    1. Are you having any GI issues? no 2. Do you have a personal history of Polyps?  Colonoscopy 15 years ago, No polyps removed 3. Do you have a family history of Colon Cancer or Polyps? no 4. Diabetes Mellitus? no 5. Joint replacements in the past 12 months?no 6. Major health problems in the past 3 months?no 7. Any artificial heart valves, MVP, or defibrillator?no    MEDICATIONS & ALLERGIES:    Patient reports the following regarding taking any anticoagulation/antiplatelet therapy:   Plavix, Coumadin, Eliquis, Xarelto, Lovenox, Pradaxa, Brilinta, or Effient? no Aspirin? no  Patient confirms/reports the following medications:  Current Outpatient Medications  Medication Sig Dispense Refill   citalopram (CELEXA) 20 MG tablet Take 1 tablet (20 mg total) by mouth daily. 90 tablet 1   No current facility-administered medications for this visit.    Patient confirms/reports the following allergies:  No Known Allergies  No orders of the defined types were placed in this encounter.   AUTHORIZATION INFORMATION Primary Insurance: 1D#: Group #:  Secondary Insurance: 1D#: Group #:  SCHEDULE INFORMATION: Date: 07/06/21 Time: Location: [place]

## 2021-07-06 ENCOUNTER — Other Ambulatory Visit: Payer: Self-pay

## 2021-07-06 ENCOUNTER — Ambulatory Visit: Payer: 59 | Admitting: Anesthesiology

## 2021-07-06 ENCOUNTER — Ambulatory Visit
Admission: RE | Admit: 2021-07-06 | Discharge: 2021-07-06 | Disposition: A | Discharge status: Home / Self Care | Payer: 59 | Attending: Gastroenterology | Admitting: Gastroenterology

## 2021-07-06 ENCOUNTER — Encounter: Payer: Self-pay | Admitting: Gastroenterology

## 2021-07-06 ENCOUNTER — Encounter
Admission: RE | Disposition: A | Discharge status: Home / Self Care | Payer: Self-pay | Source: Home / Self Care | Attending: Gastroenterology

## 2021-07-06 DIAGNOSIS — K635 Polyp of colon: Secondary | ICD-10-CM | POA: Diagnosis not present

## 2021-07-06 DIAGNOSIS — Z823 Family history of stroke: Secondary | ICD-10-CM | POA: Diagnosis not present

## 2021-07-06 DIAGNOSIS — Z8042 Family history of malignant neoplasm of prostate: Secondary | ICD-10-CM | POA: Insufficient documentation

## 2021-07-06 DIAGNOSIS — Z79899 Other long term (current) drug therapy: Secondary | ICD-10-CM | POA: Insufficient documentation

## 2021-07-06 DIAGNOSIS — D122 Benign neoplasm of ascending colon: Secondary | ICD-10-CM | POA: Diagnosis not present

## 2021-07-06 DIAGNOSIS — Z8249 Family history of ischemic heart disease and other diseases of the circulatory system: Secondary | ICD-10-CM | POA: Insufficient documentation

## 2021-07-06 DIAGNOSIS — Z1211 Encounter for screening for malignant neoplasm of colon: Secondary | ICD-10-CM | POA: Diagnosis present

## 2021-07-06 DIAGNOSIS — Z833 Family history of diabetes mellitus: Secondary | ICD-10-CM | POA: Diagnosis not present

## 2021-07-06 DIAGNOSIS — D124 Benign neoplasm of descending colon: Secondary | ICD-10-CM | POA: Insufficient documentation

## 2021-07-06 DIAGNOSIS — R7303 Prediabetes: Secondary | ICD-10-CM | POA: Insufficient documentation

## 2021-07-06 HISTORY — PX: COLONOSCOPY WITH PROPOFOL: SHX5780

## 2021-07-06 LAB — HM COLONOSCOPY

## 2021-07-06 SURGERY — COLONOSCOPY WITH PROPOFOL
Anesthesia: General

## 2021-07-06 MED ORDER — SODIUM CHLORIDE 0.9 % IV SOLN: INTRAVENOUS | Status: DC

## 2021-07-06 MED ORDER — PROPOFOL 500 MG/50ML IV EMUL
INTRAVENOUS | Status: DC | PRN
  Administered 2021-07-06: 150 ug/kg/min via INTRAVENOUS

## 2021-07-06 MED ORDER — PROPOFOL 500 MG/50ML IV EMUL
INTRAVENOUS | Status: AC
  Filled 2021-07-06: qty 50

## 2021-07-06 NOTE — Transfer of Care (Signed)
Immediate Anesthesia Transfer of Care Note  Patient: Caroline Miles  Procedure(s) Performed: COLONOSCOPY WITH PROPOFOL  Patient Location: PACU  Anesthesia Type:General  Level of Consciousness: awake and sedated  Airway & Oxygen Therapy: Patient Spontanous Breathing and Patient connected to nasal cannula oxygen  Post-op Assessment: Report given to RN and Post -op Vital signs reviewed and stable  Post vital signs: Reviewed and stable  Last Vitals:  Vitals Value Taken Time  BP    Temp    Pulse    Resp    SpO2      Last Pain:  Vitals:   07/06/21 0903  TempSrc: Temporal  PainSc: 0-No pain         Complications: No notable events documented.

## 2021-07-06 NOTE — Anesthesia Procedure Notes (Signed)
Date/Time: 07/06/2021 9:57 AM Performed by: Vaughan Sine Pre-anesthesia Checklist: Patient identified, Emergency Drugs available, Suction available, Patient being monitored and Timeout performed Patient Re-evaluated:Patient Re-evaluated prior to induction Oxygen Delivery Method: Nasal cannula Preoxygenation: Pre-oxygenation with 100% oxygen Induction Type: IV induction Placement Confirmation: positive ETCO2 and CO2 detector

## 2021-07-06 NOTE — Anesthesia Preprocedure Evaluation (Signed)
Anesthesia Evaluation  Patient identified by MRN, date of birth, ID band Patient awake    Reviewed: Allergy & Precautions, NPO status , Patient's Chart, lab work & pertinent test results  Airway Mallampati: II  TM Distance: >3 FB Neck ROM: Full    Dental no notable dental hx.    Pulmonary neg pulmonary ROS,    Pulmonary exam normal        Cardiovascular negative cardio ROS Normal cardiovascular exam     Neuro/Psych Anxiety negative neurological ROS  negative psych ROS   GI/Hepatic negative GI ROS, Neg liver ROS, Bowel prep,  Endo/Other  negative endocrine ROS  Renal/GU negative Renal ROS  negative genitourinary   Musculoskeletal negative musculoskeletal ROS (+)   Abdominal   Peds negative pediatric ROS (+)  Hematology negative hematology ROS (+)   Anesthesia Other Findings   Reproductive/Obstetrics negative OB ROS                             Anesthesia Physical Anesthesia Plan  ASA: 2  Anesthesia Plan: General   Post-op Pain Management:    Induction: Intravenous  PONV Risk Score and Plan: 2 and Propofol infusion and TIVA  Airway Management Planned: Natural Airway and Nasal Cannula  Additional Equipment:   Intra-op Plan:   Post-operative Plan:   Informed Consent: I have reviewed the patients History and Physical, chart, labs and discussed the procedure including the risks, benefits and alternatives for the proposed anesthesia with the patient or authorized representative who has indicated his/her understanding and acceptance.       Plan Discussed with: CRNA, Anesthesiologist and Surgeon  Anesthesia Plan Comments:         Anesthesia Quick Evaluation

## 2021-07-06 NOTE — Anesthesia Postprocedure Evaluation (Signed)
Anesthesia Post Note  Patient: Caroline Miles  Procedure(s) Performed: COLONOSCOPY WITH PROPOFOL  Patient location during evaluation: Phase II Anesthesia Type: General Level of consciousness: awake and alert, awake and oriented Pain management: pain level controlled Vital Signs Assessment: post-procedure vital signs reviewed and stable Respiratory status: spontaneous breathing, nonlabored ventilation and respiratory function stable Cardiovascular status: blood pressure returned to baseline and stable Postop Assessment: no apparent nausea or vomiting Anesthetic complications: no   No notable events documented.   Last Vitals:  Vitals:   07/06/21 1032 07/06/21 1042  BP: (!) 146/90 (!) 156/91  Pulse: 74 67  Resp: 14 18  Temp:    SpO2: 98% 100%    Last Pain:  Vitals:   07/06/21 1042  TempSrc:   PainSc: 0-No pain                 Phill Mutter

## 2021-07-06 NOTE — H&P (Signed)
     Jonathon Bellows, MD [address], [place], [place], [place], [address] [address], [place], [place], [place], [address] Phone: [phone]  Fax: [phone]  Primary Care Physician:  Rubie Maid, MD   Pre-Procedure History & Physical: HPI:  Caroline Miles is a 55 y.o. female is here for an colonoscopy.   Past Medical History:  Diagnosis Date   Anxiety    Pre-diabetes 09/16/2017    History reviewed. No pertinent surgical history.  Prior to Admission medications   Medication Sig Start Date End Date Taking? Authorizing Provider  citalopram (CELEXA) 20 MG tablet Take 1 tablet (20 mg total) by mouth daily. 12/21/20  Yes Rubie Maid, MD    Allergies as of 06/29/2021   (No Known Allergies)    Family History  Problem Relation Age of Onset   Diabetes Mother    Hypertension Mother    Stroke Mother    Diabetes Father    Prostate cancer Father    Breast cancer Neg Hx     Social History   Socioeconomic History   Marital status: Married    Spouse name: Not on file   Number of children: Not on file   Years of education: Not on file   Highest education level: Not on file  Occupational History   Not on file  Tobacco Use   Smoking status: Never   Smokeless tobacco: Never  Vaping Use   Vaping Use: Never used  Substance and Sexual Activity   Alcohol use: Yes    Comment: social   Drug use: No   Sexual activity: Yes  Other Topics Concern   Not on file  Social History Narrative   Not on file   Social Determinants of Health   Financial Resource Strain: Not on file  Food Insecurity: Not on file  Transportation Needs: Not on file  Physical Activity: Not on file  Stress: Not on file  Social Connections: Not on file  Intimate Partner Violence: Not on file    Review of Systems: See HPI, otherwise negative ROS  Physical Exam: BP (!) 145/88   Pulse 85   Temp (!) 97.3 F (36.3 C) (Temporal)   Resp 18   Ht 5\' 6"  (1.676 m)   Wt 68 kg   LMP  (LMP  Unknown)   SpO2 97%   BMI 24.21 kg/m  General:   Alert,  pleasant and cooperative in NAD Head:  Normocephalic and atraumatic. Neck:  Supple; no masses or thyromegaly. Lungs:  Clear throughout to auscultation, normal respiratory effort.    Heart:  +S1, +S2, Regular rate and rhythm, No edema. Abdomen:  Soft, nontender and nondistended. Normal bowel sounds, without guarding, and without rebound.   Neurologic:  Alert and  oriented x4;  grossly normal neurologically.  Impression/Plan: Caroline Miles is here for an colonoscopy to be performed for Screening colonoscopy average risk   Risks, benefits, limitations, and alternatives regarding  colonoscopy have been reviewed with the patient.  Questions have been answered.  All parties agreeable.   Jonathon Bellows, MD  07/06/2021, 9:46 AM

## 2021-07-06 NOTE — Op Note (Signed)
[facility] Patient Name: Caroline Miles Procedure Date: 07/06/2021 9:51 AM MRN: [id number] Account #: [id number] Date of Birth: 1966-09-19 Admit Type: Outpatient Age: 55 Room: [facility] ENDO ROOM 3 Gender: Female Note Status: Finalized Procedure:             Colonoscopy Indications:           Screening for colorectal malignant neoplasm Providers:             Jonathon Bellows MD, MD Referring MD:          Chesley Noon. Marcelline Mates (Referring MD) Medicines:             Monitored Anesthesia Care Complications:         No immediate complications. Procedure:             Pre-Anesthesia Assessment:                        - Prior to the procedure, a History and Physical was                         performed, and patient medications, allergies and                         sensitivities were reviewed. The patient's tolerance                         of previous anesthesia was reviewed.                        - The risks and benefits of the procedure and the                         sedation options and risks were discussed with the                         patient. All questions were answered and informed                         consent was obtained.                        - ASA Grade Assessment: II - A patient with mild                         systemic disease.                        After obtaining informed consent, the colonoscope was                         passed under direct vision. Throughout the procedure,                         the patient's blood pressure, pulse, and oxygen                         saturations were monitored continuously. The                         Colonoscope was introduced through the anus and  advanced to the the cecum, identified by the                         appendiceal orifice. The colonoscopy was performed                         with ease. The patient tolerated the procedure well.                         The quality of the  bowel preparation was excellent. Findings:      The perianal and digital rectal examinations were normal.      Two sessile polyps were found in the descending colon and ascending       colon. The polyps were 5 to 9 mm in size. These polyps were removed with       a cold snare. Resection and retrieval were complete.      The exam was otherwise without abnormality on direct and retroflexion       views. Impression:            - Two 5 to 9 mm polyps in the descending colon and in                         the ascending colon, removed with a cold snare.                         Resected and retrieved.                        - The examination was otherwise normal on direct and                         retroflexion views. Recommendation:        - Discharge patient to home (with escort).                        - Resume previous diet.                        - Await pathology results.                        - Repeat colonoscopy for surveillance based on                         pathology results. Procedure Code(s):     --- Professional ---                        [phone], Colonoscopy, flexible; with removal of                         tumor(s), polyp(s), or other lesion(s) by snare                         technique Diagnosis Code(s):     --- Professional ---                        Z12.11, Encounter for screening for malignant neoplasm  of colon                        K63.5, Polyp of colon CPT copyright 2019 American Medical Association. All rights reserved. The codes documented in this report are preliminary and upon coder review may  be revised to meet current compliance requirements. Jonathon Bellows, MD Jonathon Bellows MD, MD 07/06/2021 10:10:04 AM This report has been signed electronically. Number of Addenda: 0 Note Initiated On: 07/06/2021 9:51 AM Scope Withdrawal Time: 0 hours 9 minutes 20 seconds  Total Procedure Duration: 0 hours 12 minutes 53 seconds  Estimated Blood Loss:   Estimated blood loss: none.      [facility]

## 2021-07-07 ENCOUNTER — Encounter: Payer: Self-pay | Admitting: Gastroenterology

## 2021-07-07 LAB — SURGICAL PATHOLOGY

## 2021-09-12 ENCOUNTER — Other Ambulatory Visit: Payer: Self-pay | Admitting: Obstetrics and Gynecology

## 2021-10-11 ENCOUNTER — Other Ambulatory Visit: Payer: Self-pay

## 2021-10-11 ENCOUNTER — Encounter: Payer: Self-pay | Admitting: Internal Medicine

## 2021-10-11 ENCOUNTER — Ambulatory Visit (INDEPENDENT_AMBULATORY_CARE_PROVIDER_SITE_OTHER): Payer: 59 | Admitting: Internal Medicine

## 2021-10-11 VITALS — BP 142/92 | HR 53 | Temp 96.0°F | Ht 66.0 in | Wt 156.0 lb

## 2021-10-11 DIAGNOSIS — R030 Elevated blood-pressure reading, without diagnosis of hypertension: Secondary | ICD-10-CM

## 2021-10-11 DIAGNOSIS — R7303 Prediabetes: Secondary | ICD-10-CM

## 2021-10-11 DIAGNOSIS — F411 Generalized anxiety disorder: Secondary | ICD-10-CM | POA: Diagnosis not present

## 2021-10-11 DIAGNOSIS — R635 Abnormal weight gain: Secondary | ICD-10-CM | POA: Diagnosis not present

## 2021-10-11 NOTE — Progress Notes (Signed)
Subjective:  Patient ID: Caroline Miles, female    DOB: [birth year]  Age: 55 y.o. MRN: [id number]  CC: The primary encounter diagnosis was Elevated blood pressure reading in office without diagnosis of hypertension. Diagnoses of Weight gain, GAD (generalized anxiety disorder), Pre-diabetes, and Elevated blood pressure reading without diagnosis of hypertension were also pertinent to this visit.  HPI Caroline Miles presents for establishment of care.  Referred by husband ,  Caroline Miles.   History Caroline Miles has a past medical history of Allergy, Anxiety, and Pre-diabetes (09/16/2017).   She has a past surgical history that includes Colonoscopy with propofol (N/A, 07/06/2021) and Cesarean section (1997).   Her family history includes Arthritis in her sister; Diabetes in her brother, father, mother, and sister; Hearing loss in her father, mother, and sister; Heart disease in her father; Hyperlipidemia in her mother and sister; Hypertension in her mother and sister; Prostate cancer in her father; Stroke in her mother.She reports that she has never smoked. She has never used smokeless tobacco. She reports current alcohol use. She reports that she does not use drugs.  Up to date on PAP smears   GAD  history;  started 18 years ago during divorce from daughter's father .  Attempts to  self wean  from SSRI have failed due to resulting dysphoria   Outpatient Medications Prior to Visit  Medication Sig Dispense Refill   citalopram (CELEXA) 20 MG tablet TAKE 1 TABLET(20 MG) BY MOUTH DAILY 90 tablet 2   No facility-administered medications prior to visit.    Review of Systems:  Patient denies headache, fevers, malaise, unintentional weight loss, skin rash, eye pain, sinus congestion and sinus pain, sore throat, dysphagia,  hemoptysis , cough, dyspnea, wheezing, chest pain, palpitations, orthopnea, edema, abdominal pain, nausea, melena, diarrhea, constipation, flank pain, dysuria, hematuria, urinary  Frequency,  nocturia, numbness, tingling, seizures,  Focal weakness, Loss of consciousness,  Tremor, insomnia, depression, anxiety, and suicidal ideation.     Objective:  BP (!) 142/92 (BP Location: Left Arm, Patient Position: Sitting, Cuff Size: Normal)   Pulse (!) 53   Temp (!) 96 F (35.6 C) (Temporal)   Ht 5\' 6"  (1.676 m)   Wt 156 lb (70.8 kg)   LMP  (LMP Unknown)   SpO2 99%   BMI 25.18 kg/m   Physical Exam:  General appearance: alert, cooperative and appears stated age Ears: normal TM's and external ear canals both ears Throat: lips, mucosa, and tongue normal; teeth and gums normal Neck: no adenopathy, no carotid bruit, supple, symmetrical, trachea midline and thyroid not enlarged, symmetric, no tenderness/mass/nodules Back: symmetric, no curvature. ROM normal. No CVA tenderness. Lungs: clear to auscultation bilaterally Heart: regular rate and rhythm, S1, S2 normal, no murmur, click, rub or gallop Abdomen: soft, non-tender; bowel sounds normal; no masses,  no organomegaly Pulses: 2+ and symmetric Skin: Skin color, texture, turgor normal. No rashes or lesions Lymph nodes: Cervical, supraclavicular, and axillary nodes normal.   Assessment & Plan:   Problem List Items Addressed This Visit     GAD (generalized anxiety disorder)    Continue citalopram as she has been taking it for over 15 years and prior attempts to self wean have been unsuccessful       Elevated blood pressure reading without diagnosis of hypertension    She  has no prior history of hypertension. She will check her blood pressure several times over the next 3-4 weeks and to submit readings for evaluation. Urine microalbumin to creatinine ratio done today  is normal.  Lab Results  Component Value Date   MICROALBUR <0.7 10/11/2021           Weight gain    I have  recommended a low glycemic index diet utilizing smaller more frequent meals to increase metabolism.  I have also recommended that patient start  exercising with a goal of 30 minutes of aerobic exercise a minimum of 5 days per week. Awaiting  recent labs        Pre-diabetes    Reviewed the role of exercise and weight loss in management       Other Visit Diagnoses     Elevated blood pressure reading in office without diagnosis of hypertension    -  Primary   Relevant Orders   Microalbumin / creatinine urine ratio (Completed)       I am having Caroline Miles maintain her citalopram.  No orders of the defined types were placed in this encounter.   There are no discontinued medications.  Follow-up: Return in about 6 months (around 04/11/2022).   Crecencio Mc, MD

## 2021-10-11 NOTE — Patient Instructions (Addendum)
[employer] makes very good/accurate BP machines  Check BP 5 times over the next 2 weeks AND SEND me the readings

## 2021-10-12 DIAGNOSIS — R030 Elevated blood-pressure reading, without diagnosis of hypertension: Secondary | ICD-10-CM | POA: Insufficient documentation

## 2021-10-12 DIAGNOSIS — F411 Generalized anxiety disorder: Secondary | ICD-10-CM | POA: Insufficient documentation

## 2021-10-12 DIAGNOSIS — I10 Essential (primary) hypertension: Secondary | ICD-10-CM | POA: Insufficient documentation

## 2021-10-12 LAB — MICROALBUMIN / CREATININE URINE RATIO
Creatinine,U: 154.5 mg/dL
Microalb Creat Ratio: 0.5 mg/g (ref 0.0–30.0)
Microalb, Ur: <0.7 mg/dL (ref 0.0–1.9)

## 2021-10-12 NOTE — Assessment & Plan Note (Signed)
Reviewed the role of exercise and weight loss in management

## 2021-10-12 NOTE — Assessment & Plan Note (Signed)
I have  recommended a low glycemic index diet utilizing smaller more frequent meals to increase metabolism.  I have also recommended that patient start exercising with a goal of 30 minutes of aerobic exercise a minimum of 5 days per week. Awaiting  recent labs

## 2021-10-12 NOTE — Assessment & Plan Note (Signed)
She  has no prior history of hypertension. She will check her blood pressure several times over the next 3-4 weeks and to submit readings for evaluation. Urine microalbumin to creatinine ratio done today is normal.  Lab Results  Component Value Date   MICROALBUR <0.7 10/11/2021

## 2021-10-12 NOTE — Assessment & Plan Note (Signed)
Continue citalopram as she has been taking it for over 15 years and prior attempts to self wean have been unsuccessful

## 2022-04-11 ENCOUNTER — Encounter: Payer: Self-pay | Admitting: Internal Medicine

## 2022-04-11 ENCOUNTER — Ambulatory Visit (INDEPENDENT_AMBULATORY_CARE_PROVIDER_SITE_OTHER): Payer: 59 | Admitting: Internal Medicine

## 2022-04-11 VITALS — BP 142/80 | HR 72 | Temp 98.3°F | Ht 66.0 in | Wt 157.8 lb

## 2022-04-11 DIAGNOSIS — Z1231 Encounter for screening mammogram for malignant neoplasm of breast: Secondary | ICD-10-CM

## 2022-04-11 DIAGNOSIS — Z23 Encounter for immunization: Secondary | ICD-10-CM

## 2022-04-11 DIAGNOSIS — F411 Generalized anxiety disorder: Secondary | ICD-10-CM

## 2022-04-11 DIAGNOSIS — R7303 Prediabetes: Secondary | ICD-10-CM

## 2022-04-11 DIAGNOSIS — R030 Elevated blood-pressure reading, without diagnosis of hypertension: Secondary | ICD-10-CM

## 2022-04-11 DIAGNOSIS — R5383 Other fatigue: Secondary | ICD-10-CM

## 2022-04-11 DIAGNOSIS — E785 Hyperlipidemia, unspecified: Secondary | ICD-10-CM

## 2022-04-11 DIAGNOSIS — R635 Abnormal weight gain: Secondary | ICD-10-CM

## 2022-04-11 MED ORDER — CITALOPRAM HYDROBROMIDE 20 MG PO TABS: ORAL_TABLET | ORAL | 2 refills | Status: DC

## 2022-04-11 NOTE — Patient Instructions (Addendum)
Your annual mammogram has been ordered.  You are encouraged (required) to call to make your appointment at [facility]  ? ?PLEASE CHECK BP AT HOME AND SEND ME SOME READINGS ? ?TDAP VACCINE GIVEN TODAY ,  GOOD FOR TEN YEARS  ? ?Return in 6 months for your CPE ?

## 2022-04-11 NOTE — Progress Notes (Signed)
? ?Subjective:  ?Patient ID: Caroline Miles, female    DOB: [birth year]  Age: 56 y.o. MRN: [id number] ? ?CC: The primary encounter diagnosis was Pre-diabetes. Diagnoses of Hyperlipidemia, unspecified hyperlipidemia type, Other fatigue, Encounter for screening mammogram for malignant neoplasm of breast, Need for Tdap vaccination, Elevated blood pressure reading without diagnosis of hypertension, GAD (generalized anxiety disorder), and Weight gain were also pertinent to this visit. ? ? ?This visit occurred during the SARS-CoV-2 public health emergency.  Safety protocols were in place, including screening questions prior to the visit, additional usage of staff PPE, and extensive cleaning of exam room while observing appropriate contact time as indicated for disinfecting solutions.   ? ?HPI ?Caroline Miles presents for  ?Chief Complaint  ?Patient presents with  ? Follow-up  ?  6 month follow up   ? ?1) elevated blood pressure.  Has not checked BP at home ? ?2) weight gain since getting married  cooking for two ,  husband eats differently than her  ? ?3) sleep pattern change:  latency of 20 minutes or more.  No evening caffeine,  no blue lit devices.  More hot flashes at night .   menopause occurred a few years ago  ? ? ? ?Outpatient Medications Prior to Visit  ?Medication Sig Dispense Refill  ? citalopram (CELEXA) 20 MG tablet TAKE 1 TABLET(20 MG) BY MOUTH DAILY 90 tablet 2  ? ?No facility-administered medications prior to visit.  ? ? ?Review of Systems; ? ?Patient denies headache, fevers, malaise, unintentional weight loss, skin rash, eye pain, sinus congestion and sinus pain, sore throat, dysphagia,  hemoptysis , cough, dyspnea, wheezing, chest pain, palpitations, orthopnea, edema, abdominal pain, nausea, melena, diarrhea, constipation, flank pain, dysuria, hematuria, urinary  Frequency, nocturia, numbness, tingling, seizures,  Focal weakness, Loss of consciousness,  Tremor, insomnia, depression, anxiety, and suicidal  ideation.   ? ? ? ?Objective:  ?BP (!) 142/80 (BP Location: Left Arm, Patient Position: Sitting, Cuff Size: Normal)   Pulse 72   Temp 98.3 ?F (36.8 ?C) (Oral)   Ht '5\' 6"'$  (1.676 m)   Wt 157 lb 12.8 oz (71.6 kg)   LMP  (LMP Unknown)   SpO2 98%   BMI 25.47 kg/m?  ? ?BP Readings from Last 3 Encounters:  ?04/11/22 (!) 142/80  ?10/11/21 (!) 142/92  ?07/06/21 (!) 156/91  ? ? ?Wt Readings from Last 3 Encounters:  ?04/11/22 157 lb 12.8 oz (71.6 kg)  ?10/11/21 156 lb (70.8 kg)  ?07/06/21 150 lb (68 kg)  ? ? ?General appearance: alert, cooperative and appears stated age ?Ears: normal TM's and external ear canals both ears ?Throat: lips, mucosa, and tongue normal; teeth and gums normal ?Neck: no adenopathy, no carotid bruit, supple, symmetrical, trachea midline and thyroid not enlarged, symmetric, no tenderness/mass/nodules ?Back: symmetric, no curvature. ROM normal. No CVA tenderness. ?Lungs: clear to auscultation bilaterally ?Heart: regular rate and rhythm, S1, S2 normal, no murmur, click, rub or gallop ?Abdomen: soft, non-tender; bowel sounds normal; no masses,  no organomegaly ?Pulses: 2+ and symmetric ?Skin: Skin color, texture, turgor normal. No rashes or lesions ?Lymph nodes: Cervical, supraclavicular, and axillary nodes normal. ? ?Lab Results  ?Component Value Date  ? HGBA1C 6.3 04/11/2022  ? HGBA1C 6.1 (H) 05/16/2021  ? HGBA1C 5.9 (H) 05/11/2020  ? ? ?Lab Results  ?Component Value Date  ? CREATININE 0.77 04/11/2022  ? CREATININE 0.86 05/16/2021  ? CREATININE 0.91 05/11/2020  ? ? ?Lab Results  ?Component Value Date  ? WBC 5.1 04/11/2022  ?  HGB 12.7 04/11/2022  ? HCT 38.7 04/11/2022  ? PLT 280.0 04/11/2022  ? GLUCOSE 106 (H) 04/11/2022  ? CHOL 168 04/11/2022  ? TRIG 178.0 (H) 04/11/2022  ? HDL 44.50 04/11/2022  ? LDLDIRECT 122.0 04/11/2022  ? Badger 88 04/11/2022  ? ALT 17 04/11/2022  ? AST 18 04/11/2022  ? NA 140 04/11/2022  ? K 4.3 04/11/2022  ? CL 105 04/11/2022  ? CREATININE 0.77 04/11/2022  ? BUN 16  04/11/2022  ? CO2 28 04/11/2022  ? TSH 2.71 04/11/2022  ? HGBA1C 6.3 04/11/2022  ? MICROALBUR 1.3 04/11/2022  ? ? ?No results found. ? ?Assessment & Plan:  ? ?Problem List Items Addressed This Visit   ? ? GAD (generalized anxiety disorder)  ?  Continue citalopram as she has been taking it for over 15 years and prior attempts to self wean have been unsuccessful  ? ?  ?  ? Relevant Medications  ? citalopram (CELEXA) 20 MG tablet  ? Elevated blood pressure reading without diagnosis of hypertension  ?  Reading today is again elevated .Encouraged to check at home ; otherwise will need to start medication  ? ?  ?  ? Weight gain  ?  I have  recommended a low glycemic index diet utilizing smaller more frequent meals to increase metabolism.  I have also recommended that patient start exercising with a goal of 30 minutes of aerobic exercise a minimum of 5 days per week. ? ?  ?  ? Pre-diabetes - Primary  ?  Her  random glucose is not  elevated but her A1c of 6.3  suggests she is at risk for developing diabetes.  I recommend she follow a low glycemic index diet and particpate regularly in an aerobic  exercise activity.  . reviewed the role of exercise and weight loss in management  ? ?Lab Results  ?Component Value Date  ? HGBA1C 6.3 04/11/2022  ? ? ?  ?  ? Relevant Orders  ? Comprehensive metabolic panel (Completed)  ? Hemoglobin A1c (Completed)  ? Microalbumin / creatinine urine ratio (Completed)  ? ?Other Visit Diagnoses   ? ? Hyperlipidemia, unspecified hyperlipidemia type      ? Relevant Orders  ? Lipid panel (Completed)  ? LDL cholesterol, direct (Completed)  ? Other fatigue      ? Relevant Orders  ? CBC with Differential/Platelet (Completed)  ? TSH (Completed)  ? Encounter for screening mammogram for malignant neoplasm of breast      ? Relevant Orders  ? MM 3D SCREEN BREAST BILATERAL  ? Need for Tdap vaccination      ? Relevant Orders  ? Tdap vaccine greater than or equal to 7yo IM (Completed)  ? ?  ? ? ?Follow-up: Return  in about 6 months (around 10/11/2022). ? ? ?Crecencio Mc, MD ?

## 2022-04-12 LAB — LIPID PANEL
Cholesterol: 168 mg/dL (ref 0–200)
HDL: 44.5 mg/dL (ref >39.00)
LDL Cholesterol: 88 mg/dL (ref 0–99)
NonHDL: 123.86
Total CHOL/HDL Ratio: 4
Triglycerides: 178 mg/dL — ABNORMAL HIGH (ref 0.0–149.0)
VLDL: 35.6 mg/dL (ref 0.0–40.0)

## 2022-04-12 LAB — TSH: TSH: 2.71 u[IU]/mL (ref 0.35–5.50)

## 2022-04-12 LAB — CBC WITH DIFFERENTIAL/PLATELET
Basophils Absolute: 0 10*3/uL (ref 0.0–0.1)
Basophils Relative: 0.5 % (ref 0.0–3.0)
Eosinophils Absolute: 0.3 10*3/uL (ref 0.0–0.7)
Eosinophils Relative: 5.1 % — ABNORMAL HIGH (ref 0.0–5.0)
HCT: 38.7 % (ref 36.0–46.0)
Hemoglobin: 12.7 g/dL (ref 12.0–15.0)
Lymphocytes Relative: 33.4 % (ref 12.0–46.0)
Lymphs Abs: 1.7 10*3/uL (ref 0.7–4.0)
MCHC: 32.9 g/dL (ref 30.0–36.0)
MCV: 90.8 fl (ref 78.0–100.0)
Monocytes Absolute: 0.4 10*3/uL (ref 0.1–1.0)
Monocytes Relative: 7.8 % (ref 3.0–12.0)
Neutro Abs: 2.7 10*3/uL (ref 1.4–7.7)
Neutrophils Relative %: 53.2 % (ref 43.0–77.0)
Platelets: 280 10*3/uL (ref 150.0–400.0)
RBC: 4.26 Mil/uL (ref 3.87–5.11)
RDW: 13.6 % (ref 11.5–15.5)
WBC: 5.1 10*3/uL (ref 4.0–10.5)

## 2022-04-12 LAB — COMPREHENSIVE METABOLIC PANEL
ALT: 17 U/L (ref 0–35)
AST: 18 U/L (ref 0–37)
Albumin: 4.7 g/dL (ref 3.5–5.2)
Alkaline Phosphatase: 70 U/L (ref 39–117)
BUN: 16 mg/dL (ref 6–23)
CO2: 28 mEq/L (ref 19–32)
Calcium: 9.2 mg/dL (ref 8.4–10.5)
Chloride: 105 mEq/L (ref 96–112)
Creatinine, Ser: 0.77 mg/dL (ref 0.40–1.20)
GFR: 87.54 mL/min (ref >60.00)
Glucose, Bld: 106 mg/dL — ABNORMAL HIGH (ref 70–99)
Potassium: 4.3 mEq/L (ref 3.5–5.1)
Sodium: 140 mEq/L (ref 135–145)
Total Bilirubin: 0.4 mg/dL (ref 0.2–1.2)
Total Protein: 7.4 g/dL (ref 6.0–8.3)

## 2022-04-12 LAB — MICROALBUMIN / CREATININE URINE RATIO
Creatinine,U: 170.2 mg/dL
Microalb Creat Ratio: 0.7 mg/g (ref 0.0–30.0)
Microalb, Ur: 1.3 mg/dL (ref 0.0–1.9)

## 2022-04-12 LAB — LDL CHOLESTEROL, DIRECT: Direct LDL: 122 mg/dL

## 2022-04-12 LAB — HEMOGLOBIN A1C: Hgb A1c MFr Bld: 6.3 % (ref 4.6–6.5)

## 2022-04-12 NOTE — Assessment & Plan Note (Signed)
I have  recommended a low glycemic index diet utilizing smaller more frequent meals to increase metabolism.  I have also recommended that patient start exercising with a goal of 30 minutes of aerobic exercise a minimum of 5 days per week. ?

## 2022-04-12 NOTE — Assessment & Plan Note (Signed)
Continue citalopram as she has been taking it for over 15 years and prior attempts to self wean have been unsuccessful  ?

## 2022-04-12 NOTE — Assessment & Plan Note (Signed)
Reading today is again elevated .Encouraged to check at home ; otherwise will need to start medication  ?

## 2022-04-12 NOTE — Assessment & Plan Note (Signed)
Her  random glucose is not  elevated but her A1c of 6.3  suggests she is at risk for developing diabetes.  I recommend she follow a low glycemic index diet and particpate regularly in an aerobic  exercise activity.  . reviewed the role of exercise and weight loss in management  ? ?Lab Results  ?Component Value Date  ? HGBA1C 6.3 04/11/2022  ? ? ?

## 2022-04-14 ENCOUNTER — Encounter: Payer: Self-pay | Admitting: Internal Medicine

## 2022-04-24 ENCOUNTER — Encounter: Payer: Self-pay | Admitting: Internal Medicine

## 2022-04-24 DIAGNOSIS — I10 Essential (primary) hypertension: Secondary | ICD-10-CM

## 2022-04-26 MED ORDER — AMLODIPINE BESYLATE 2.5 MG PO TABS: 2.5000 mg | ORAL_TABLET | Freq: Every day | ORAL | 1 refills | Status: DC

## 2022-04-26 NOTE — Assessment & Plan Note (Signed)
Confirmed with home readings from 004 to 599 systolic  No proteinuria.  Advised to start therapy with amlodipne 2.5 mg daily  ?

## 2022-04-30 ENCOUNTER — Telehealth: Payer: Self-pay | Admitting: Internal Medicine

## 2022-04-30 NOTE — Telephone Encounter (Signed)
At the time of note there was no 30 minutes appointments. Can we get patient in earlier? ? ?Appointment For: Caroline Miles ([id number]) ?Visit Type: OFFICE VISIT (1004) ?  ?05/24/2022    10:30 AM  30 mins.  Crecencio Mc, MD       [facility]- ?  ?Patient Comments: ?HIGH BLOOD PRESSURE MEDS. I WOULD LIKE TO COME IN SOONER IF AT ALL POSSIBLE. I AM WORRIED ABOUT MY BP READINGS. THANKS SO MUCH! ? ?

## 2022-05-01 NOTE — Telephone Encounter (Signed)
Pt has been scheduled for tomorrow at 4:45pm.  ?

## 2022-05-01 NOTE — Telephone Encounter (Signed)
Would it be okay to use a 15 minute about to discuss pt's blood pressure?  ?

## 2022-05-02 ENCOUNTER — Ambulatory Visit: Payer: 59 | Admitting: Internal Medicine

## 2022-05-04 ENCOUNTER — Encounter: Payer: Self-pay | Admitting: Internal Medicine

## 2022-05-04 ENCOUNTER — Ambulatory Visit (INDEPENDENT_AMBULATORY_CARE_PROVIDER_SITE_OTHER): Payer: 59 | Admitting: Internal Medicine

## 2022-05-04 DIAGNOSIS — I10 Essential (primary) hypertension: Secondary | ICD-10-CM

## 2022-05-04 MED ORDER — HYDROCHLOROTHIAZIDE 25 MG PO TABS: 25.0000 mg | ORAL_TABLET | Freq: Every day | ORAL | 3 refills | Status: DC

## 2022-05-04 NOTE — Assessment & Plan Note (Addendum)
Dd not start amlodipine.  Has no proteinuria , but c/o swelling in extremities.  Will start hctz instead 25 mg daily .  Goal is 130/80 or less after one week  ?

## 2022-05-04 NOTE — Patient Instructions (Addendum)
For your blood pressure ? ?Start the HCTZ 25 MG DAILY I N THE MORNING  ? ?After one week   recheck BP and if > 130/80,  LET ME KNOW VIA MYCHART  ? ?HCTZ  is a fluid pill ("diuretic")  that will lower bp by getting rid of excess fluid ? ? ?

## 2022-05-04 NOTE — Progress Notes (Signed)
? ?Subjective:  ?Patient ID: Caroline Miles, female    DOB: [birth year]  Age: 56 y.o. MRN: [id number] ? ?CC: The encounter diagnosis was Essential hypertension. ? ?HPI ?Caroline Miles presents for  ?Chief Complaint  ?Patient presents with  ? BP concerns  ? ?1) HTN:   was advised to started on amlodipine 2.5 mg daily  last month after submitted home elevated readings but did not understand her instructions and did not start medication.  Readings still elevated.  Notes morning puffiness of fingers and feet a ? ?Outpatient Medications Prior to Visit  ?Medication Sig Dispense Refill  ? citalopram (CELEXA) 20 MG tablet TAKE 1 TABLET(20 MG) BY MOUTH DAILY 90 tablet 2  ? amLODipine (NORVASC) 2.5 MG tablet Take 1 tablet (2.5 mg total) by mouth daily. 90 tablet 1  ? ?No facility-administered medications prior to visit.  ? ? ?Review of Systems; ? ?Patient denies headache, fevers, malaise, unintentional weight loss, skin rash, eye pain, sinus congestion and sinus pain, sore throat, dysphagia,  hemoptysis , cough, dyspnea, wheezing, chest pain, palpitations, orthopnea, edema, abdominal pain, nausea, melena, diarrhea, constipation, flank pain, dysuria, hematuria, urinary  Frequency, nocturia, numbness, tingling, seizures,  Focal weakness, Loss of consciousness,  Tremor, insomnia, depression, anxiety, and suicidal ideation.   ? ? ? ?Objective:  ?BP 130/82 (BP Location: Left Arm, Patient Position: Sitting, Cuff Size: Normal)   Pulse 70   Temp 97.8 ?F (36.6 ?C) (Oral)   Ht '5\' 6"'$  (1.676 m)   Wt 155 lb 9.6 oz (70.6 kg)   LMP  (LMP Unknown)   SpO2 99%   BMI 25.11 kg/m?  ? ?BP Readings from Last 3 Encounters:  ?05/04/22 130/82  ?04/11/22 (!) 142/80  ?10/11/21 (!) 142/92  ? ? ?Wt Readings from Last 3 Encounters:  ?05/04/22 155 lb 9.6 oz (70.6 kg)  ?04/11/22 157 lb 12.8 oz (71.6 kg)  ?10/11/21 156 lb (70.8 kg)  ? ? ?General appearance: alert, cooperative and appears stated age ?Ears: normal TM's and external ear canals both  ears ?Throat: lips, mucosa, and tongue normal; teeth and gums normal ?Neck: no adenopathy, no carotid bruit, supple, symmetrical, trachea midline and thyroid not enlarged, symmetric, no tenderness/mass/nodules ?Back: symmetric, no curvature. ROM normal. No CVA tenderness. ?Lungs: clear to auscultation bilaterally ?Heart: regular rate and rhythm, S1, S2 normal, no murmur, click, rub or gallop ?Abdomen: soft, non-tender; bowel sounds normal; no masses,  no organomegaly ?Pulses: 2+ and symmetric ?Skin: Skin color, texture, turgor normal. No rashes or lesions ?Lymph nodes: Cervical, supraclavicular, and axillary nodes normal. ? ?Lab Results  ?Component Value Date  ? HGBA1C 6.3 04/11/2022  ? HGBA1C 6.1 (H) 05/16/2021  ? HGBA1C 5.9 (H) 05/11/2020  ? ? ?Lab Results  ?Component Value Date  ? CREATININE 0.77 04/11/2022  ? CREATININE 0.86 05/16/2021  ? CREATININE 0.91 05/11/2020  ? ? ?Lab Results  ?Component Value Date  ? WBC 5.1 04/11/2022  ? HGB 12.7 04/11/2022  ? HCT 38.7 04/11/2022  ? PLT 280.0 04/11/2022  ? GLUCOSE 106 (H) 04/11/2022  ? CHOL 168 04/11/2022  ? TRIG 178.0 (H) 04/11/2022  ? HDL 44.50 04/11/2022  ? LDLDIRECT 122.0 04/11/2022  ? Jackson 88 04/11/2022  ? ALT 17 04/11/2022  ? AST 18 04/11/2022  ? NA 140 04/11/2022  ? K 4.3 04/11/2022  ? CL 105 04/11/2022  ? CREATININE 0.77 04/11/2022  ? BUN 16 04/11/2022  ? CO2 28 04/11/2022  ? TSH 2.71 04/11/2022  ? HGBA1C 6.3 04/11/2022  ? MICROALBUR 1.3 04/11/2022  ? ? ?  No results found. ? ?Assessment & Plan:  ? ?Problem List Items Addressed This Visit   ? ? Essential hypertension  ?  Dd not start amlodipine.  Has no proteinuria , but c/o swelling in extremities.  Will start hctz instead 25 mg daily .  Goal is 130/80 or less after one week  ? ?  ?  ? Relevant Medications  ? hydrochlorothiazide (HYDRODIURIL) 25 MG tablet  ? ? ? ?Follow-up: No follow-ups on file. ? ? ?Crecencio Mc, MD ?

## 2022-05-17 ENCOUNTER — Encounter: Payer: 59 | Admitting: Obstetrics and Gynecology

## 2022-05-23 ENCOUNTER — Encounter: Payer: 59 | Admitting: Obstetrics and Gynecology

## 2022-05-24 ENCOUNTER — Encounter: Payer: Self-pay | Admitting: Internal Medicine

## 2022-05-24 ENCOUNTER — Ambulatory Visit (INDEPENDENT_AMBULATORY_CARE_PROVIDER_SITE_OTHER): Payer: 59 | Admitting: Internal Medicine

## 2022-05-24 VITALS — BP 128/78 | HR 71 | Temp 98.0°F | Ht 66.0 in | Wt 155.0 lb

## 2022-05-24 DIAGNOSIS — I10 Essential (primary) hypertension: Secondary | ICD-10-CM | POA: Diagnosis not present

## 2022-05-24 DIAGNOSIS — F411 Generalized anxiety disorder: Secondary | ICD-10-CM | POA: Diagnosis not present

## 2022-05-24 DIAGNOSIS — R7303 Prediabetes: Secondary | ICD-10-CM

## 2022-05-24 LAB — BASIC METABOLIC PANEL
BUN: 16 mg/dL (ref 6–23)
CO2: 30 mEq/L (ref 19–32)
Calcium: 9.8 mg/dL (ref 8.4–10.5)
Chloride: 101 mEq/L (ref 96–112)
Creatinine, Ser: 0.96 mg/dL (ref 0.40–1.20)
GFR: 67.13 mL/min (ref >60.00)
Glucose, Bld: 81 mg/dL (ref 70–99)
Potassium: 3.9 mEq/L (ref 3.5–5.1)
Sodium: 141 mEq/L (ref 135–145)

## 2022-05-24 MED ORDER — ZOSTER VAC RECOMB ADJUVANTED 50 MCG/0.5ML IM SUSR: 0.5000 mL | Freq: Once | INTRAMUSCULAR | 1 refills | Status: AC

## 2022-05-24 NOTE — Progress Notes (Unsigned)
Subjective:  Patient ID: Caroline Miles, female    DOB: 1966-05-03  Age: 56 y.o. MRN: [id number]  CC: There were no encounter diagnoses.   HPI Caroline Miles presents for  Chief Complaint  Patient presents with   Follow-up    Follow up on hypertension. Pt stated that her blood pressure has been elevated.    1) HTN:  taking hctz 25 mg  since May 13    for the last week bps have been  130/80   no side effects.   2) GAD:  managed with citalopram for 18 years  prior attempts to wean resulted in increased symptoms . 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   Concerned about husband's lack of interest in outdoor activities due to bilateral foot pain that has not responded to orthotics and steroid injections   3) daughter Caroline Miles recently saw derm for a rash and was given doxy   Outpatient Medications Prior to Visit  Medication Sig Dispense Refill   citalopram (CELEXA) 20 MG tablet TAKE 1 TABLET(20 MG) BY MOUTH DAILY 90 tablet 2   hydrochlorothiazide (HYDRODIURIL) 25 MG tablet Take 1 tablet (25 mg total) by mouth daily. 90 tablet 3   No facility-administered medications prior to visit.    Review of Systems;  Patient denies headache, fevers, malaise, unintentional weight loss, skin rash, eye pain, sinus congestion and sinus pain, sore throat, dysphagia,  hemoptysis , cough, dyspnea, wheezing, chest pain, palpitations, orthopnea, edema, abdominal pain, nausea, melena, diarrhea, constipation, flank pain, dysuria, hematuria, urinary  Frequency, nocturia, numbness, tingling, seizures,  Focal weakness, Loss of consciousness,  Tremor, insomnia, depression, anxiety, and suicidal ideation.      Objective:  BP 128/78 (BP Location: Left Arm, Patient Position: Sitting, Cuff Size: Normal)   Pulse 71   Temp 98 F (36.7 C) (Oral)   Ht '5\' 6"'$  (1.676 m)   Wt 155 lb (70.3 kg)   LMP  (LMP  Unknown)   SpO2 96%   BMI 25.02 kg/m   BP Readings from Last 3 Encounters:  05/24/22 128/78  05/04/22 130/82  04/11/22 (!) 142/80    Wt Readings from Last 3 Encounters:  05/24/22 155 lb (70.3 kg)  05/04/22 155 lb 9.6 oz (70.6 kg)  04/11/22 157 lb 12.8 oz (71.6 kg)    General appearance: alert, cooperative and appears stated age Ears: normal TM's and external ear canals both ears Throat: lips, mucosa, and tongue normal; teeth and gums normal Neck: no adenopathy, no carotid bruit, supple, symmetrical, trachea midline and thyroid not enlarged, symmetric, no tenderness/mass/nodules Back: symmetric, no curvature. ROM normal. No CVA tenderness. Lungs: clear to auscultation bilaterally Heart: regular rate and rhythm, S1, S2 normal, no murmur, click, rub or gallop Abdomen: soft, non-tender; bowel sounds normal; no masses,  no organomegaly Pulses: 2+ and symmetric Skin: Skin color, texture, turgor normal. No rashes or lesions Lymph nodes: Cervical, supraclavicular, and axillary nodes normal.  Lab Results  Component Value Date   HGBA1C 6.3 04/11/2022   HGBA1C 6.1 (H) 05/16/2021   HGBA1C 5.9 (H) 05/11/2020    Lab Results  Component Value Date   CREATININE 0.77 04/11/2022   CREATININE 0.86 05/16/2021   CREATININE 0.91 05/11/2020    Lab Results  Component Value Date   WBC 5.1 04/11/2022   HGB 12.7 04/11/2022   HCT 38.7 04/11/2022   PLT 280.0 04/11/2022  GLUCOSE 106 (H) 04/11/2022   CHOL 168 04/11/2022   TRIG 178.0 (H) 04/11/2022   HDL 44.50 04/11/2022   LDLDIRECT 122.0 04/11/2022   LDLCALC 88 04/11/2022   ALT 17 04/11/2022   AST 18 04/11/2022   NA 140 04/11/2022   K 4.3 04/11/2022   CL 105 04/11/2022   CREATININE 0.77 04/11/2022   BUN 16 04/11/2022   CO2 28 04/11/2022   TSH 2.71 04/11/2022   HGBA1C 6.3 04/11/2022   MICROALBUR 1.3 04/11/2022    No results found.  Assessment & Plan:   Problem List Items Addressed This Visit   None   I spent a total of    minutes with this patient in a face to face visit on the date of this encounter reviewing the last office visit with me on        ,  most recent with patient's cardiologist in    ,  patient'ss diet and eating habits, home blood pressure readings ,  most recent imaging study ,   and post visit ordering of testing and therapeutics.    Follow-up: No follow-ups on file.   Crecencio Mc, MD

## 2022-05-24 NOTE — Patient Instructions (Signed)
Your  blood pressure is under excellent control on the hctz 25 mg daily .  Return for repeat diabetes screening after July 20

## 2022-05-26 NOTE — Assessment & Plan Note (Signed)
Well controlled on hctz 25 mg daily.  Repeat labs are normal ,  no changes today.

## 2022-05-26 NOTE — Assessment & Plan Note (Addendum)
Continue citalopram as she has been taking it for over 15 years and prior attempts to self wean have been unsuccessful .  Current stressors include her work load  And husband's persistent foot pain negatively affecting his interest in lack of interest in activities outside of work .  counselling given

## 2022-06-12 ENCOUNTER — Emergency Department: Payer: 59

## 2022-06-12 ENCOUNTER — Emergency Department
Admission: EM | Admit: 2022-06-12 | Discharge: 2022-06-12 | Disposition: A | Discharge status: Home / Self Care | Payer: 59 | Attending: Emergency Medicine | Admitting: Emergency Medicine

## 2022-06-12 ENCOUNTER — Encounter: Payer: Self-pay | Admitting: Emergency Medicine

## 2022-06-12 ENCOUNTER — Telehealth: Payer: Self-pay | Admitting: Internal Medicine

## 2022-06-12 ENCOUNTER — Other Ambulatory Visit: Payer: Self-pay

## 2022-06-12 DIAGNOSIS — M5412 Radiculopathy, cervical region: Secondary | ICD-10-CM | POA: Diagnosis not present

## 2022-06-12 DIAGNOSIS — I10 Essential (primary) hypertension: Secondary | ICD-10-CM | POA: Insufficient documentation

## 2022-06-12 DIAGNOSIS — R0602 Shortness of breath: Secondary | ICD-10-CM | POA: Insufficient documentation

## 2022-06-12 DIAGNOSIS — R5381 Other malaise: Secondary | ICD-10-CM | POA: Insufficient documentation

## 2022-06-12 DIAGNOSIS — R200 Anesthesia of skin: Secondary | ICD-10-CM

## 2022-06-12 LAB — TROPONIN I (HIGH SENSITIVITY): Troponin I (High Sensitivity): <2 ng/L (ref <18)

## 2022-06-12 LAB — CBC
HCT: 41.1 % (ref 36.0–46.0)
Hemoglobin: 13.1 g/dL (ref 12.0–15.0)
MCH: 29 pg (ref 26.0–34.0)
MCHC: 31.9 g/dL (ref 30.0–36.0)
MCV: 91.1 fL (ref 80.0–100.0)
Platelets: 295 10*3/uL (ref 150–400)
RBC: 4.51 MIL/uL (ref 3.87–5.11)
RDW: 13 % (ref 11.5–15.5)
WBC: 4.5 10*3/uL (ref 4.0–10.5)
nRBC: 0 % (ref 0.0–0.2)

## 2022-06-12 LAB — URINALYSIS, ROUTINE W REFLEX MICROSCOPIC
Bacteria, UA: NONE SEEN
Bilirubin Urine: NEGATIVE
Glucose, UA: NEGATIVE mg/dL
Ketones, ur: NEGATIVE mg/dL
Leukocytes,Ua: NEGATIVE
Nitrite: NEGATIVE
Protein, ur: NEGATIVE mg/dL
Specific Gravity, Urine: 1.01 (ref 1.005–1.030)
pH: 7 (ref 5.0–8.0)

## 2022-06-12 LAB — BASIC METABOLIC PANEL
Anion gap: 11 (ref 5–15)
BUN: 17 mg/dL (ref 6–20)
CO2: 27 mmol/L (ref 22–32)
Calcium: 9.7 mg/dL (ref 8.9–10.3)
Chloride: 102 mmol/L (ref 98–111)
Creatinine, Ser: 0.75 mg/dL (ref 0.44–1.00)
GFR, Estimated: >60 mL/min (ref >60)
Glucose, Bld: 103 mg/dL — ABNORMAL HIGH (ref 70–99)
Potassium: 3.9 mmol/L (ref 3.5–5.1)
Sodium: 140 mmol/L (ref 135–145)

## 2022-06-12 LAB — PREGNANCY, URINE: Preg Test, Ur: NEGATIVE

## 2022-06-12 IMAGING — MR MR CERVICAL SPINE W/O CM
5 series · 35 of 48 positions shown · non-contrast
Comparison: Prior head CT examinations [DATE] and earlier.

CLINICAL DATA: Provided history: Neuro deficit, acute, stroke
suspected. Additional history provided: Numbness/tingling in left
arm, numbness in both legs.

EXAM:
MRI HEAD WITHOUT CONTRAST
MRI CERVICAL SPINE WITHOUT CONTRAST
TECHNIQUE: Multiplanar, multiecho pulse sequences of the brain and surrounding
structures, and cervical spine, to include the craniocervical
junction and cervicothoracic junction, were obtained without
intravenous contrast.

[Series 9: T2 · sagittal · 3.0mm · 0.62mm/px · 6 of 15 slices shown (1 of 2)]
[im 1/15]
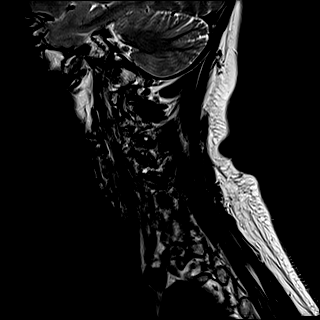
[im 3/15]
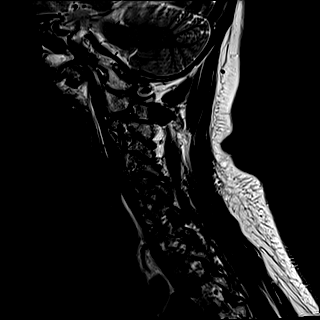
[im 6/15]
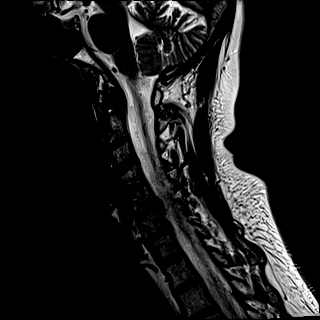
[im 9/15]
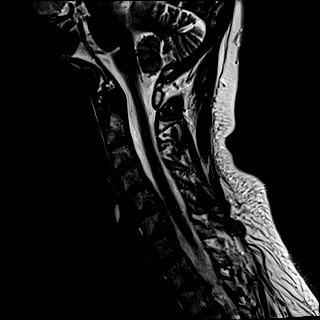
[im 12/15]
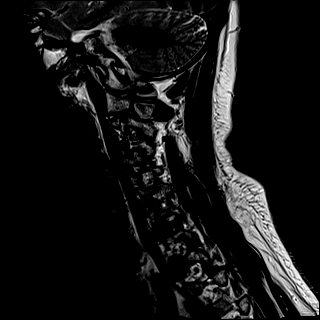
[im 15/15]
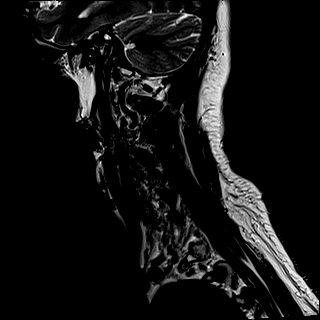

[Series 10: FLAIR · sagittal · 3.0mm · 0.78mm/px · 7 of 15 slices shown]
[im 1/15]
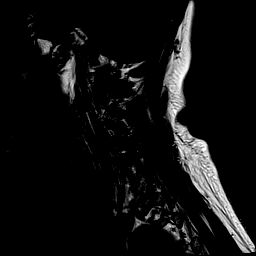
[im 3/15]
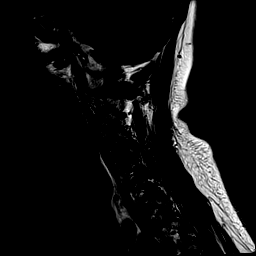
[im 5/15]
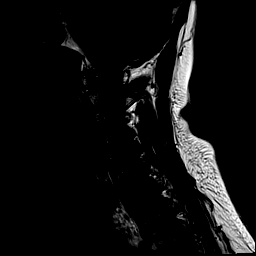
[im 8/15]
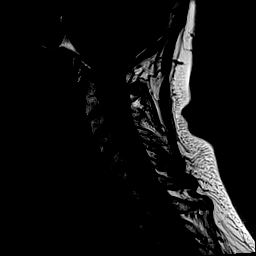
[im 10/15]
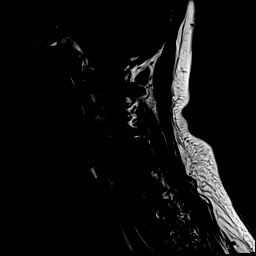
[im 12/15]
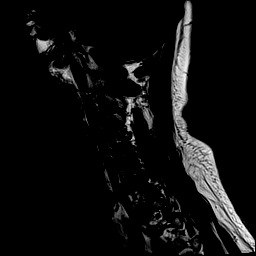
[im 15/15]
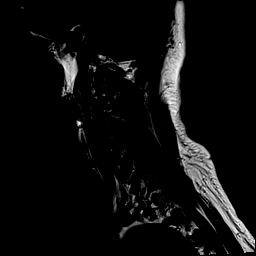

[Series 11: STIR · sagittal · 3.0mm · 0.62mm/px · 7 of 15 slices shown]
[im 1/15]
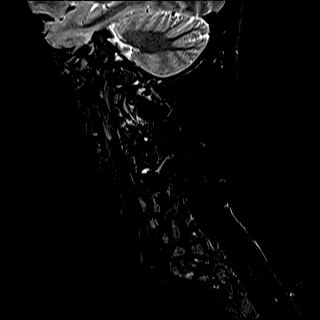
[im 3/15]
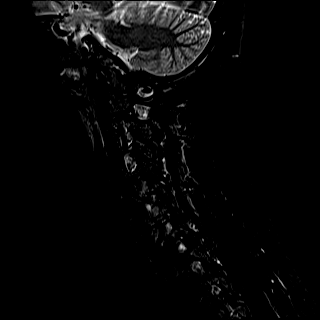
[im 5/15]
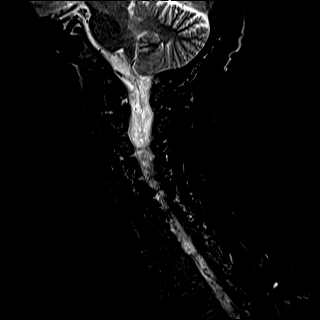
[im 8/15]
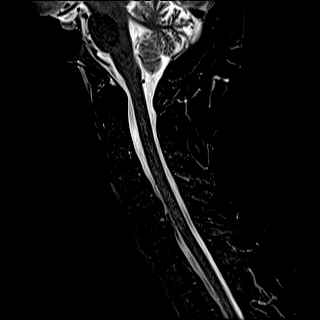
[im 10/15]
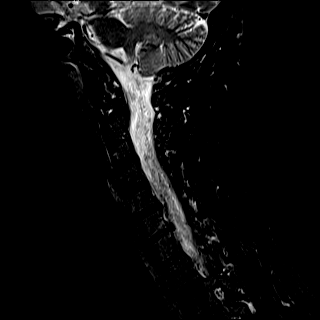
[im 12/15]
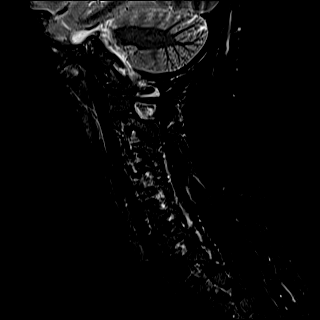
[im 15/15]
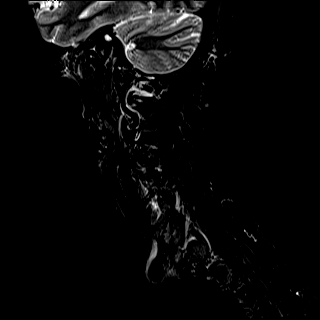

[Series 12: T2 · axial · 3.0mm · 0.70mm/px · z∈[-201,-107]mm · 8 of 29 slices shown (2 of 2)]
[im 1/29]
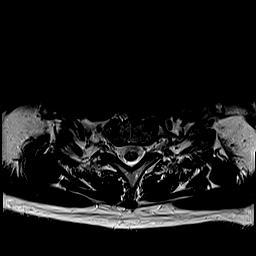
[im 5/29]
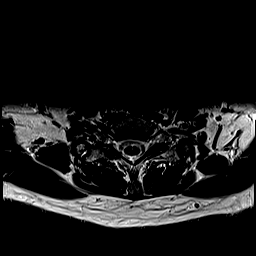
[im 9/29]
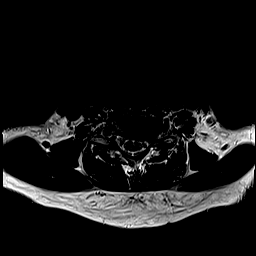
[im 13/29]
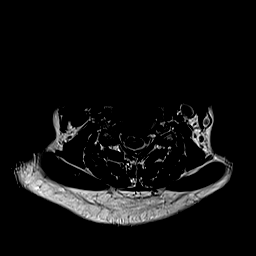
[im 16/29]
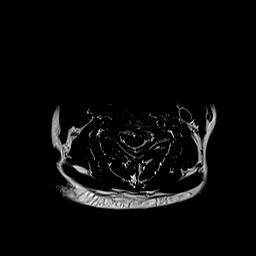
[im 20/29]
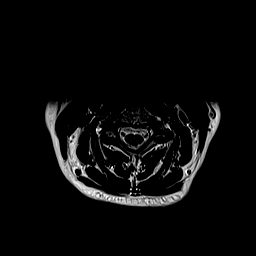
[im 24/29]
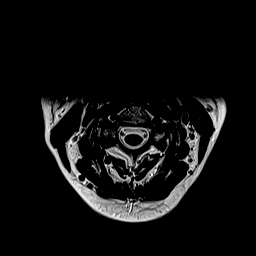
[im 29/29]
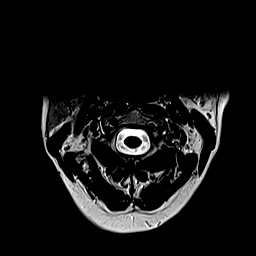

[Series 13: ax mpgr · axial · 3.0mm · 0.35mm/px · z∈[-201,-124]mm · 7 of 29 slices shown]
[im 1/29]
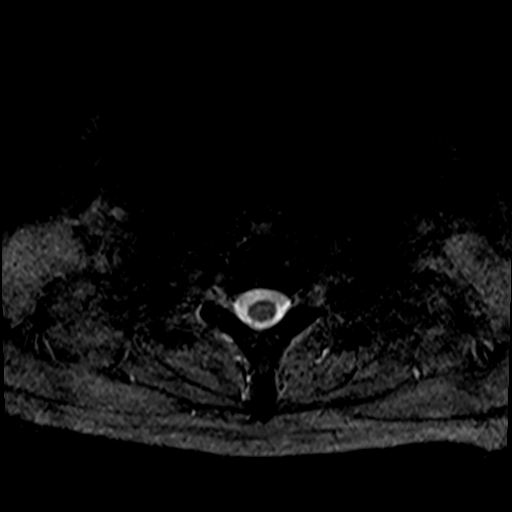
[im 5/29]
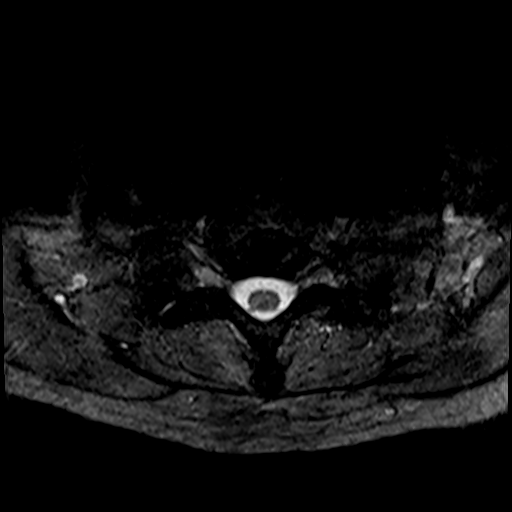
[im 9/29]
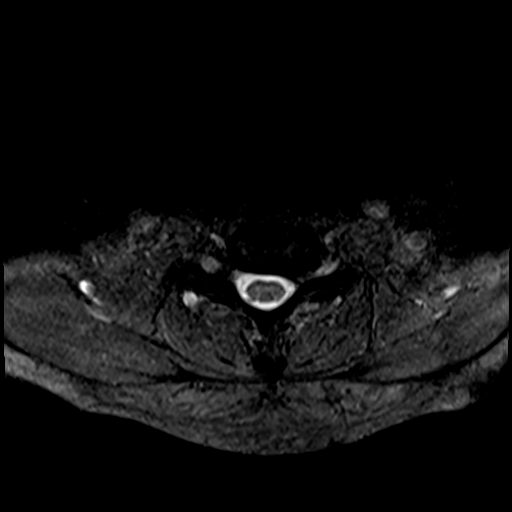
[im 13/29]
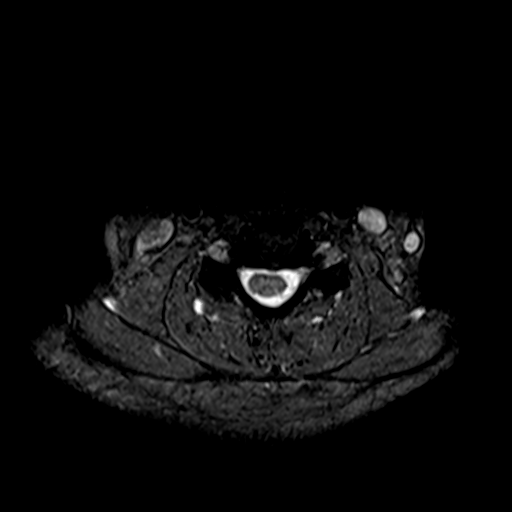
[im 16/29]
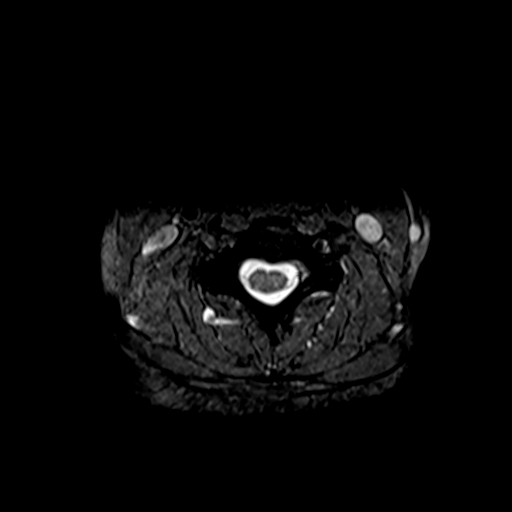
[im 20/29]
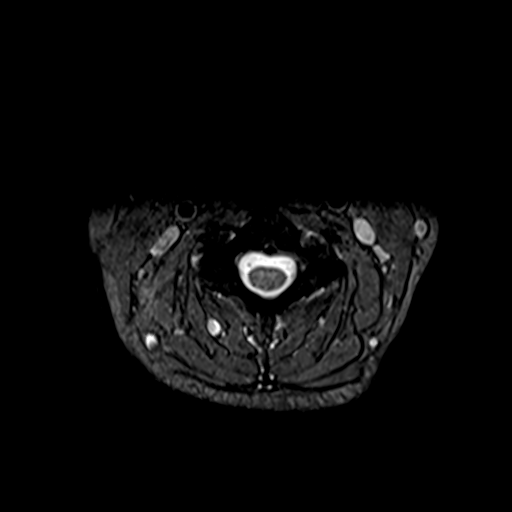
[im 24/29]
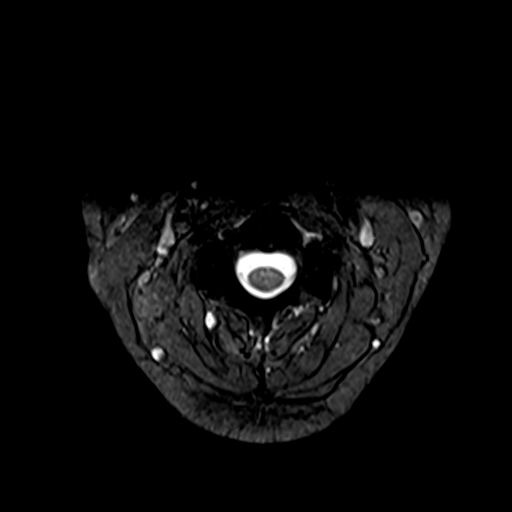

[35 of 48 positions shown; findings below may reference images not displayed]

FINDINGS: MRI HEAD FINDINGS

Brain:

Cerebral volume is normal.

No cortical encephalomalacia is identified. No significant cerebral
white matter disease.

There is no acute infarct.

No evidence of an intracranial mass.

No chronic intracranial blood products.

No extra-axial fluid collection.

No midline shift.

Vascular: Maintained flow voids within the proximal large arterial
vessels.

Skull and upper cervical spine: No focal suspicious marrow lesion.

Sinuses/Orbits: No mass or acute finding within the imaged orbits.
Trace mucosal thickening within the bilateral ethmoid sinuses.

MRI CERVICAL SPINE FINDINGS

Mild intermittent motion degradation.

Alignment: Straightening of the expected cervical lordosis. Trace
grade 1 anterolisthesis at C3-C4. 2 mm grade 1 anterolisthesis at
C4-C5.

Vertebrae: Vertebral body height is maintained. No significant
marrow edema or focal suspicious osseous lesion.

Cord: No signal abnormality within the visualized distal spinal
cord.

Posterior Fossa, vertebral arteries, paraspinal tissues: No
abnormality identified within included portions of the posterior
fossa. Flow voids preserved within the imaged cervical vertebral
arteries. No paraspinal mass or collection.

Disc levels:

Mild-to-moderate disc degeneration at C6-C7. No more than mild disc
degeneration at the remaining levels.

C2-C3: Facet arthrosis. No significant disc herniation or spinal
canal stenosis.

C3-C4: Trace grade 1 anterolisthesis. Slight disc uncovering. Mild
bilateral uncovertebral hypertrophy (greater on the left). Facet
arthrosis (greater on the left). No significant disc herniation or
spinal canal stenosis. Mild relative left neural foraminal
narrowing.

C4-C5: 2 mm grade 1 anterolisthesis. Mild disc uncovering. Mild
uncovertebral hypertrophy on the right. Facet arthrosis (greater on
the right). No significant spinal canal or foraminal stenosis.
Minimal

C5-C6: Disc bulge. Superimposed shallow broad-based central disc
protrusion. Right-sided disc osteophyte ridge/uncinate hypertrophy.
Uncovertebral hypertrophy also present on the left. The disc
protrusion contributes to mild spinal canal narrowing and contacts
the ventral aspect of the spinal cord. Mild right neural foraminal
narrowing.

C6-C7: Shallow disc bulge. Superimposed left foraminal disc
protrusion (series 9, image 11). Bilateral uncovertebral
hypertrophy. Minimal facet arthrosis. No significant spinal canal
stenosis. Moderate/severe left neural foraminal narrowing.

C7-T1: No significant disc herniation or stenosis.
IMPRESSION: MRI brain:

Unremarkable non-contrast MRI appearance of the brain. No evidence
of acute intracranial abnormality.

MRI cervical spine:

1. Cervical spondylosis, as outlined and with findings most notably
as follows.
2. At C6-C7, a left foraminal disc protrusion contributes to
moderate/severe left neural foraminal narrowing. Correlate for left
C7 radiculopathy.
3. At C5-C6, a broad-based central disc protrusion contributes to
mild spinal canal narrowing and contacts the ventral spinal cord.
4. Additional sites of mild foraminal stenosis, as detailed.
5. Disc degeneration is greatest at C6-C7 (mild-to-moderate at this
level).
6. Nonspecific straightening of the expected cervical lordosis.
7. Mild grade 1 anterolisthesis at C3-C4 and C4-C5.
8. No lesions identified within the cervical spinal cord.

## 2022-06-12 IMAGING — CR DG CHEST 2V
1 series · 2 of 2 positions shown · non-contrast
Comparison: Chest CT [DATE]. Report from chest radiographs
[DATE] (images unavailable).

CLINICAL DATA: Provided history: Shortness of breath.

EXAM:
CHEST - 2 VIEW

[Series 1: dg chest 2 view · 0.14mm/px · 2 of 2 slices shown]
[im 1/2]
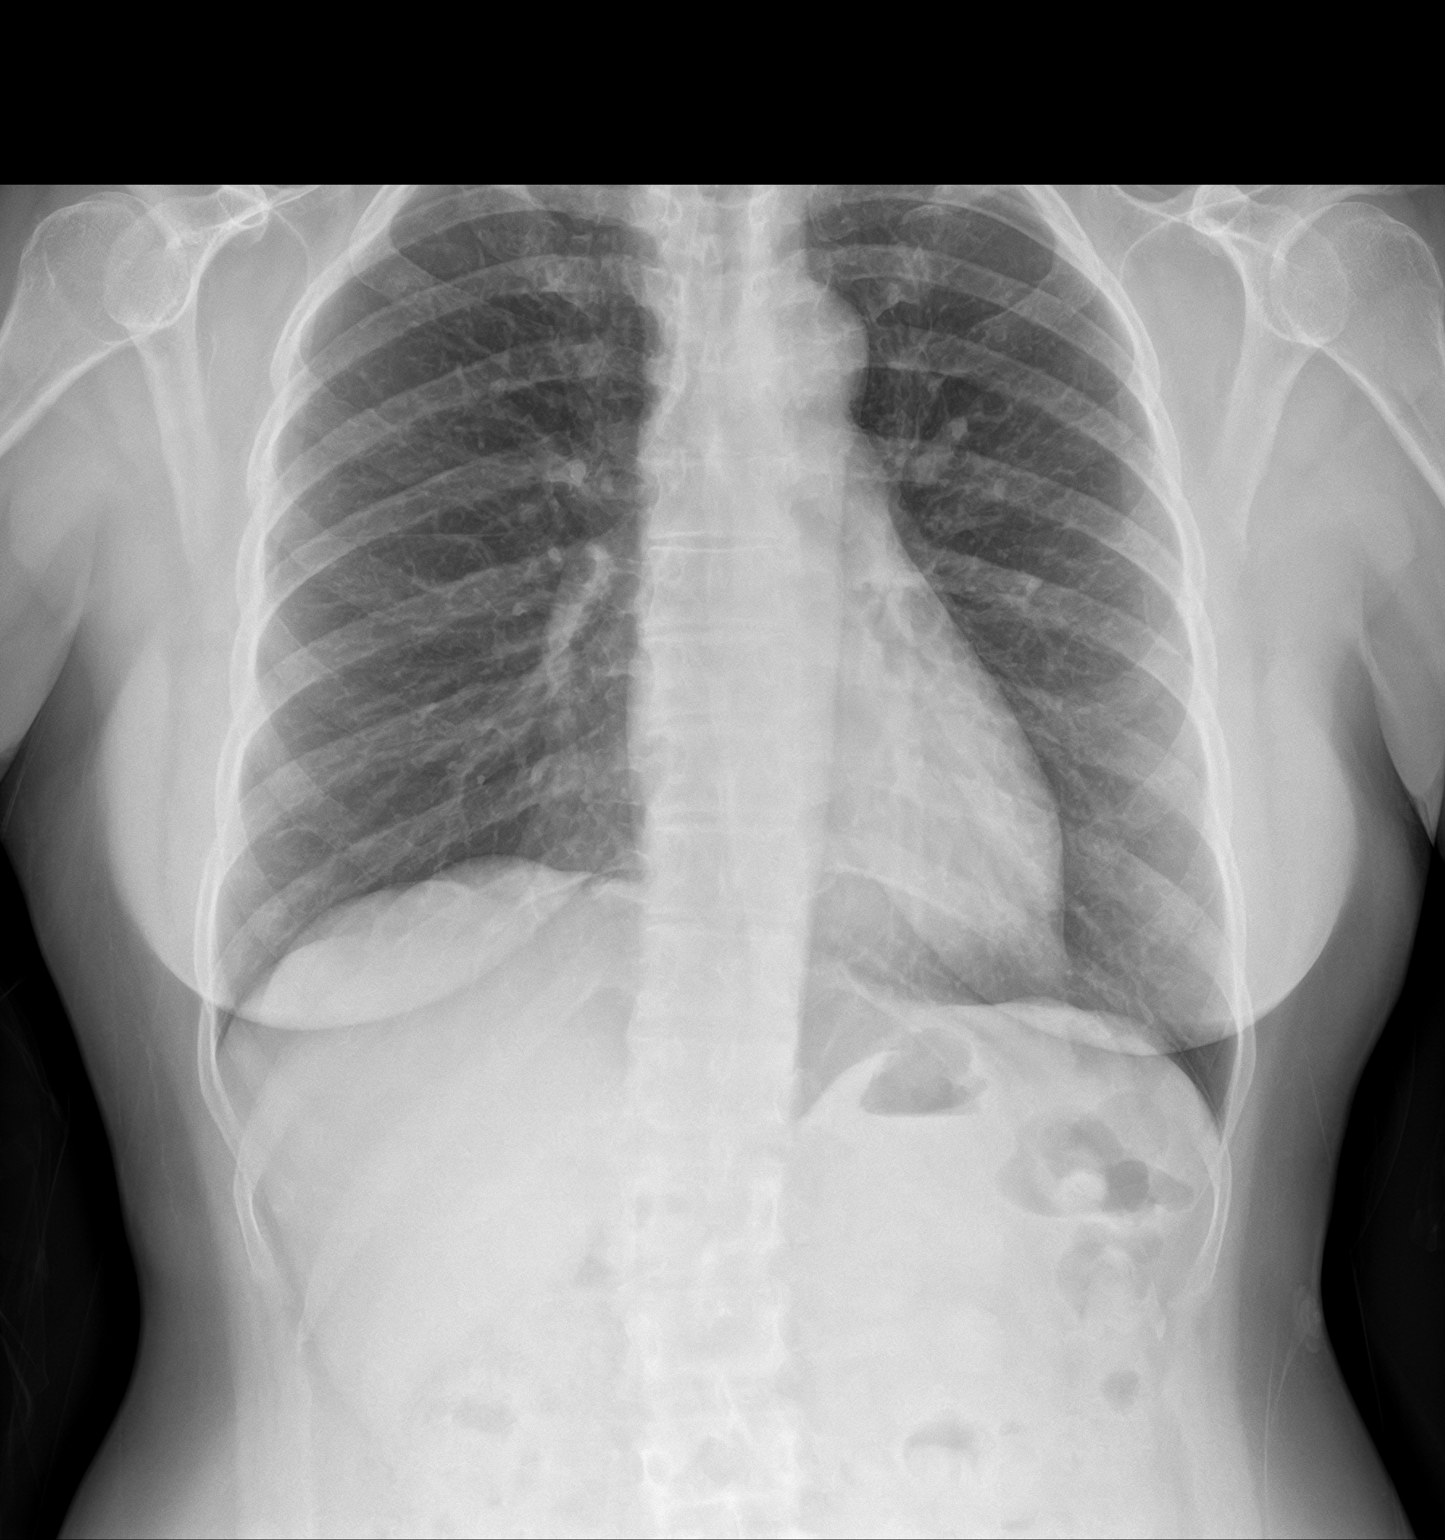
[im 2/2]
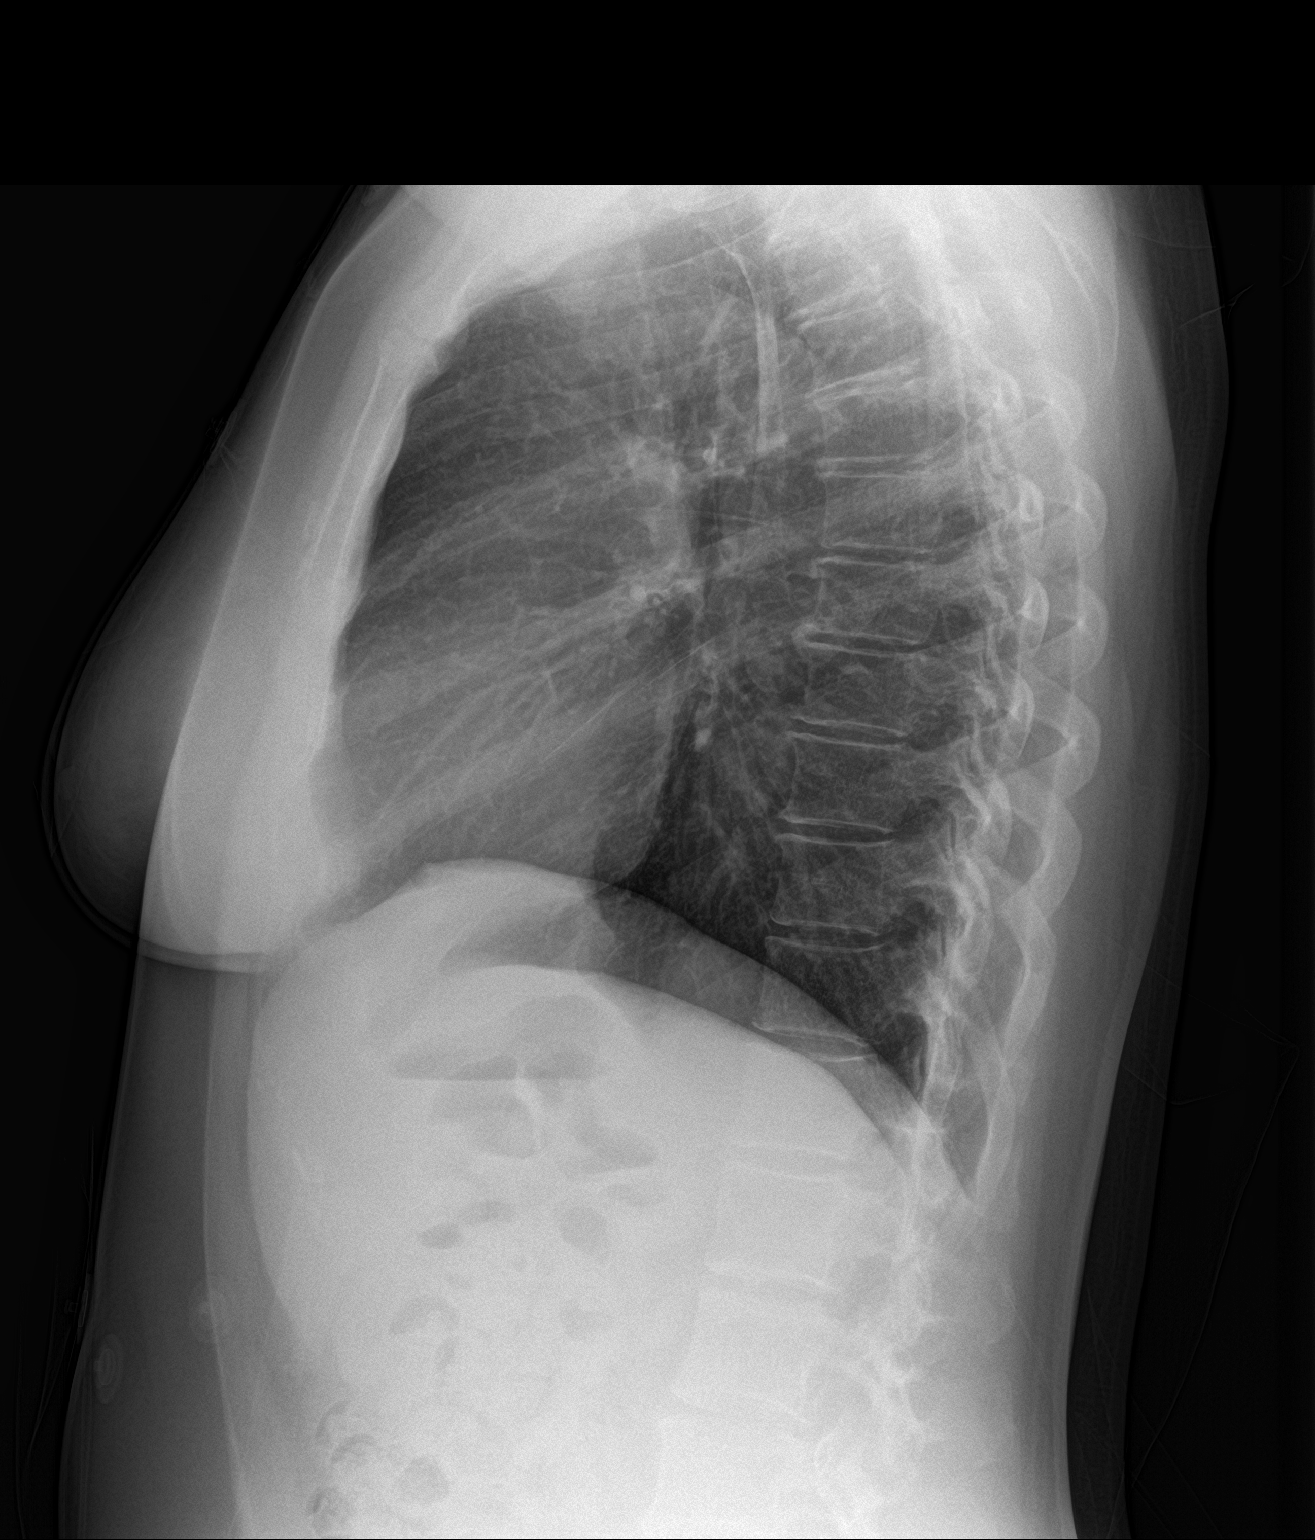

[2 of 2 positions shown; findings below may reference images not displayed]

FINDINGS: Heart size within normal limits. No appreciable airspace
consolidation or pulmonary edema. No evidence of pleural effusion or
pneumothorax. No acute bony abnormality identified. Mild
dextrocurvature of the lower thoracic and upper lumbar spine.
IMPRESSION: No evidence of active cardiopulmonary disease.

## 2022-06-12 IMAGING — CT CT HEAD W/O CM
4 series · 17 of 47 positions shown, 19 images · non-contrast
Comparison: None Available.

CLINICAL DATA: Numbness or tingling, paresthesia



[Series 2: head bone · axial · 0.39mm/px · z∈[-84,-32]mm · 4 of 75 slices shown]
[im 8/75  bone]
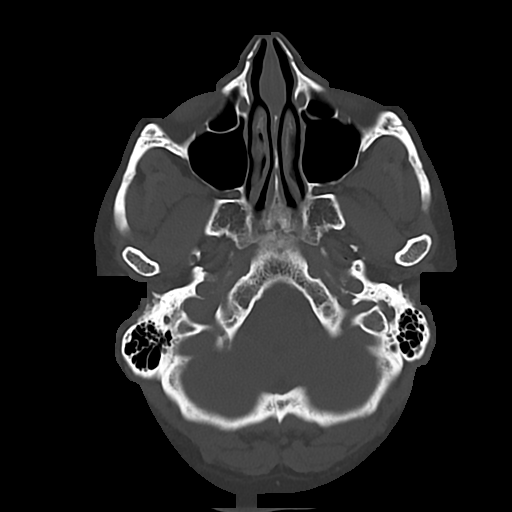
[im 15/75  bone]
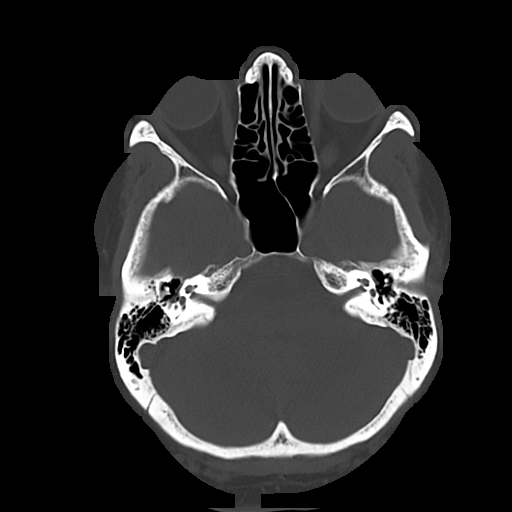
[im 23/75  bone]
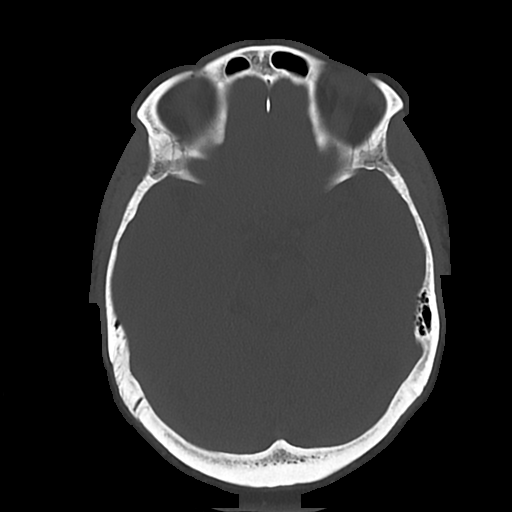
[im 34/75  bone]
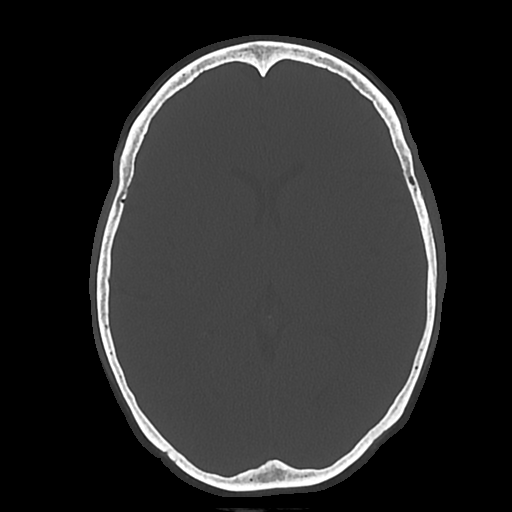

[Series 3: head wo · axial · 0.39mm/px · z∈[-84,+26]mm · 7 of 30 slices shown, 9 images]
[im 4/30  brain]
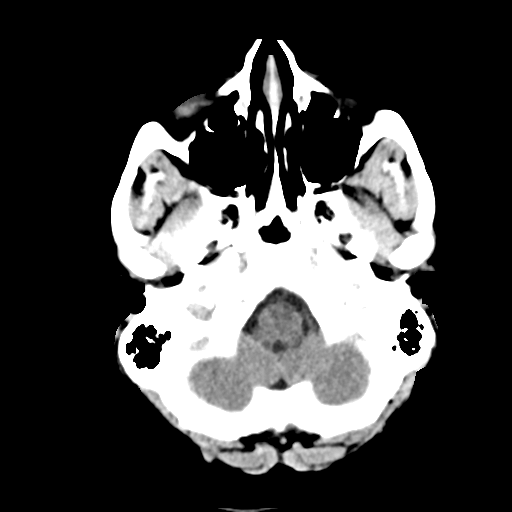
[im 4/30  bone]
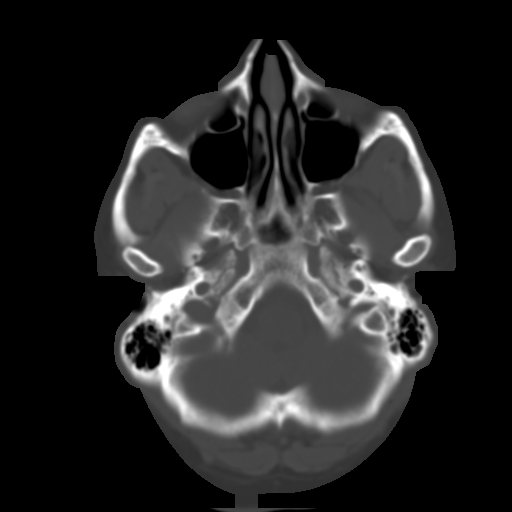
[im 8/30  brain]
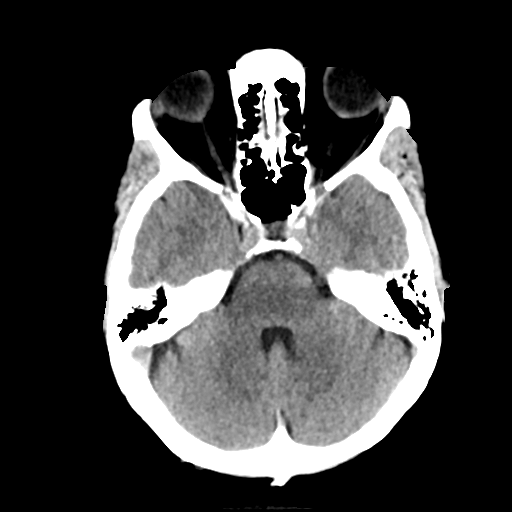
[im 11/30  brain]
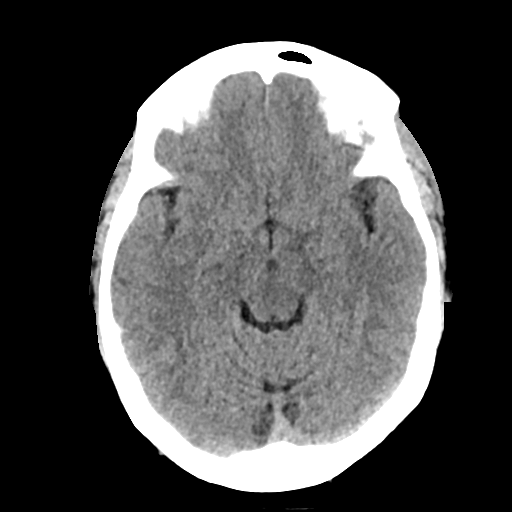
[im 15/30  brain]
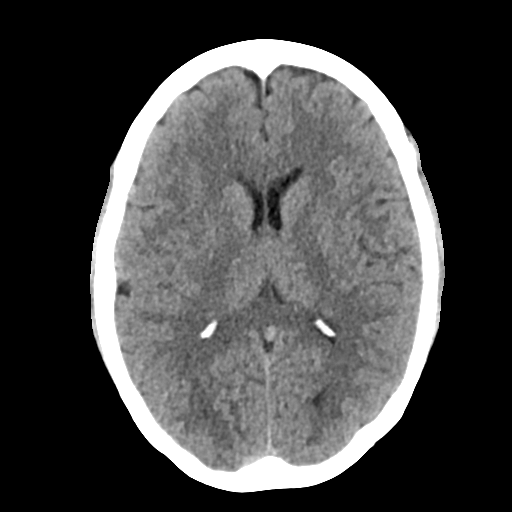
[im 19/30  brain]
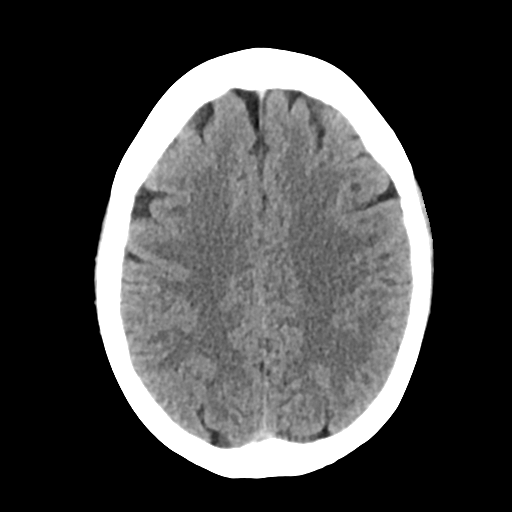
[im 19/30  bone]
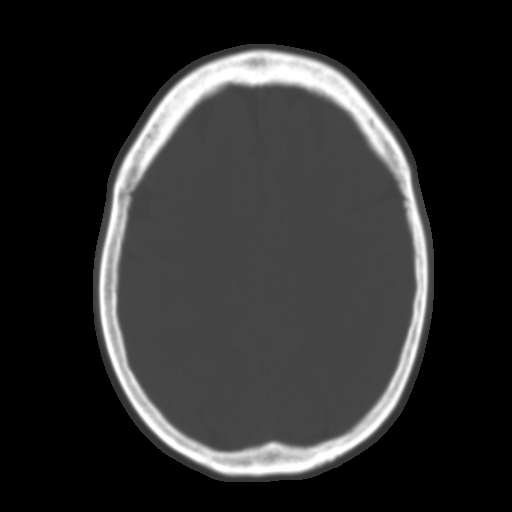
[im 22/30  brain]
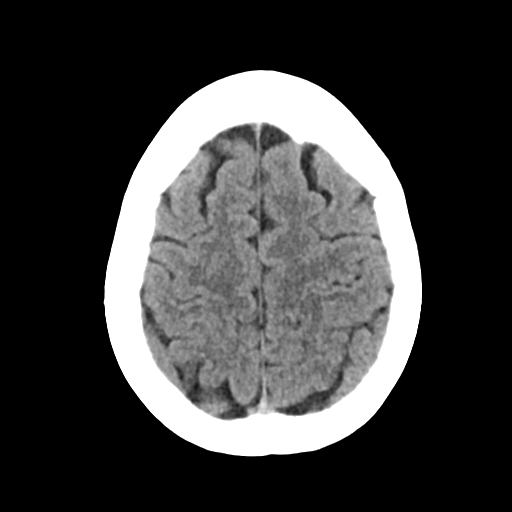
[im 26/30  brain]
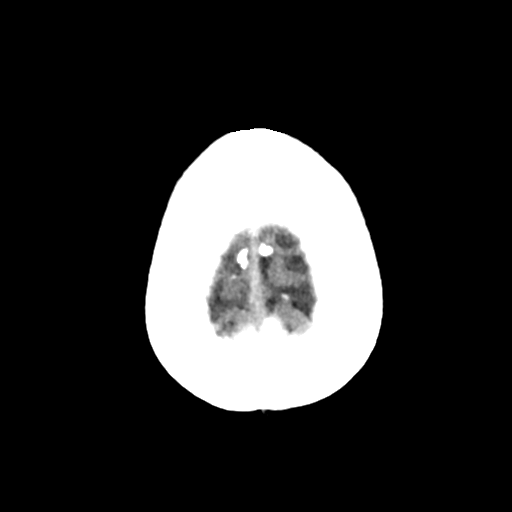

[Series 4: cor soft · coronal · 0.31mm/px · 3 of 66 slices shown]
[im 22/66  brain]
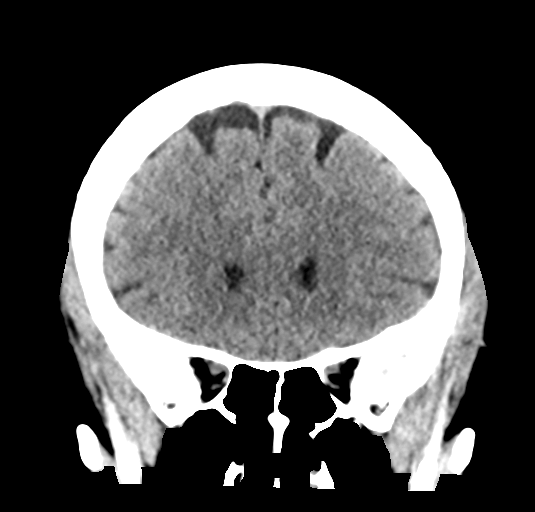
[im 29/66  brain]
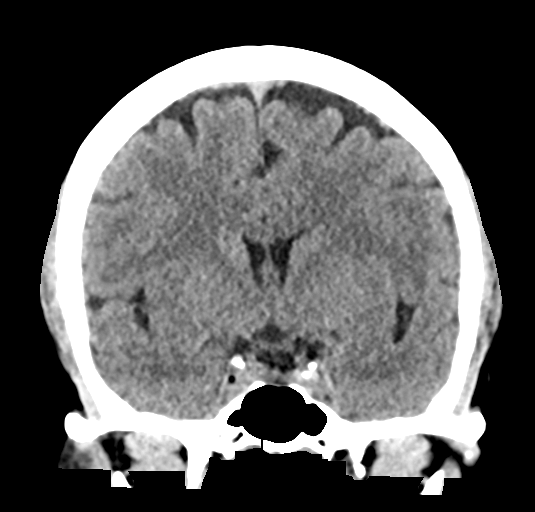
[im 37/66  brain]
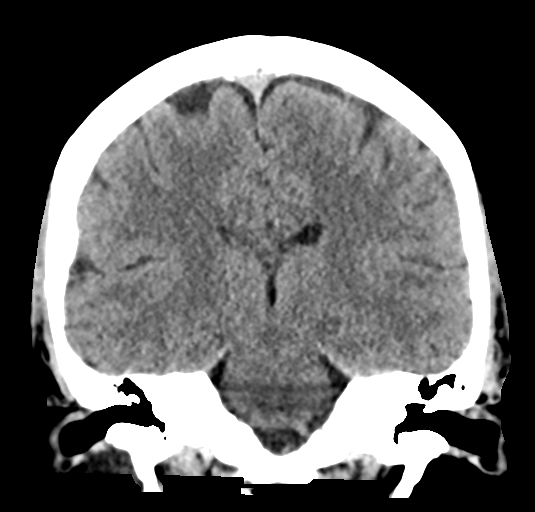

[Series 5: sag soft · sagittal · 0.32mm/px · 3 of 55 slices shown]
[im 19/55  brain]
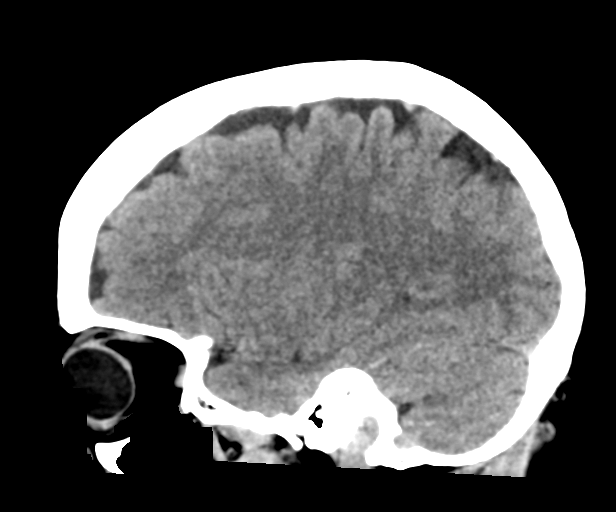
[im 28/55  brain]
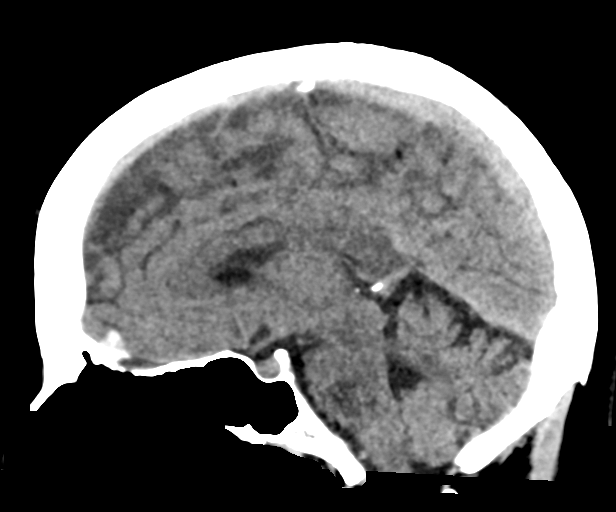
[im 37/55  brain]
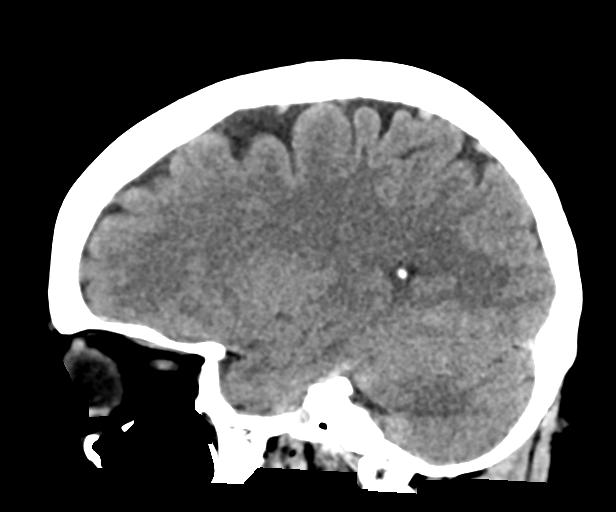

[17 of 47 positions shown; findings below may reference images not displayed]

FINDINGS: Brain: There is no acute intracranial hemorrhage, mass effect, or
edema. Gray-white differentiation is preserved. There is no
extra-axial fluid collection. Ventricles and sulci are within normal
limits in size and configuration.

Vascular: No hyperdense vessel or unexpected calcification.

Skull: Calvarium is unremarkable.

Sinuses/Orbits: No acute finding.

Other: None.
IMPRESSION: No acute intracranial abnormality.

## 2022-06-12 IMAGING — MR MR HEAD W/O CM
12 series · 40 of 48 positions shown · non-contrast
Comparison: Prior head CT examinations [DATE] and earlier.

CLINICAL DATA: Provided history: Neuro deficit, acute, stroke
suspected. Additional history provided: Numbness/tingling in left
arm, numbness in both legs.

EXAM:
MRI HEAD WITHOUT CONTRAST
MRI CERVICAL SPINE WITHOUT CONTRAST
TECHNIQUE: Multiplanar, multiecho pulse sequences of the brain and surrounding
structures, and cervical spine, to include the craniocervical
junction and cervicothoracic junction, were obtained without
intravenous contrast.

[Series 5: ax dwi_tracew · axial · 3.0mm · 0.65mm/px · z∈[-90,+64]mm · 4 of 48 slices shown]
[im 1/48]
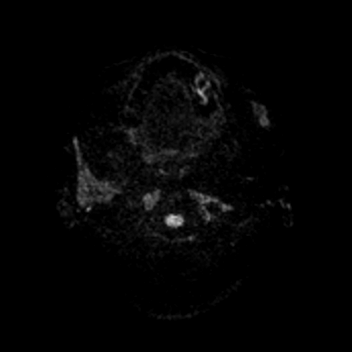
[im 16/48]
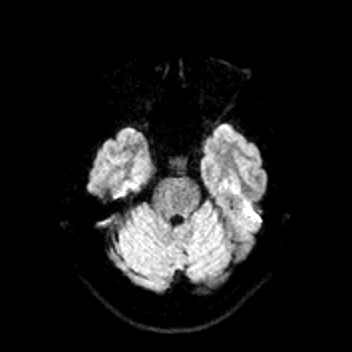
[im 32/48]
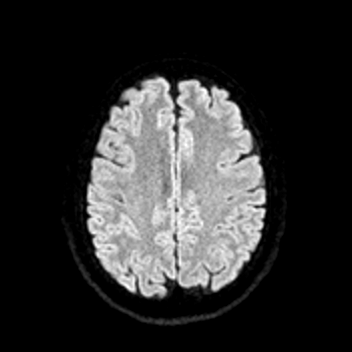
[im 48/48]
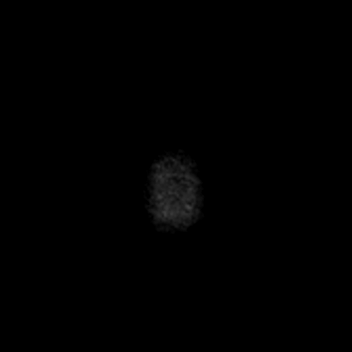

[Series 6: ax dwi_adc · axial · 3.0mm · 0.65mm/px · z∈[-90,+64]mm · 3 of 48 slices shown]
[im 1/48]
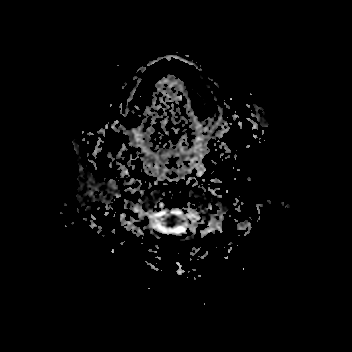
[im 24/48]
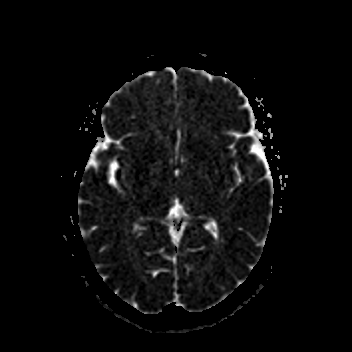
[im 48/48]
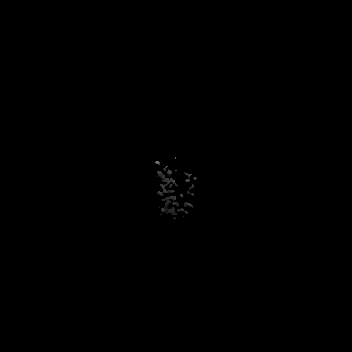

[Series 7: cor dwi_tracew · coronal · 5.0mm · 0.65mm/px · 3 of 40 slices shown]
[im 1/40]
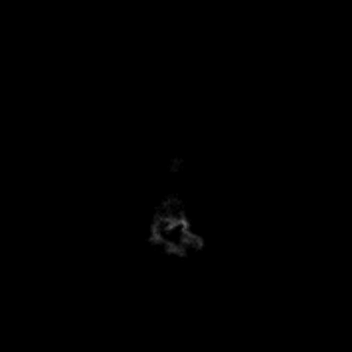
[im 20/40]
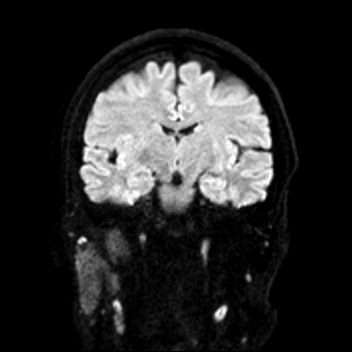
[im 40/40]
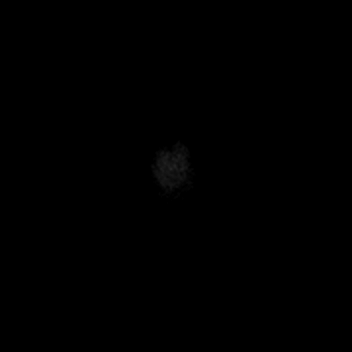

[Series 8: cor dwi_adc · coronal · 5.0mm · 0.65mm/px · 3 of 38 slices shown]
[im 1/38]
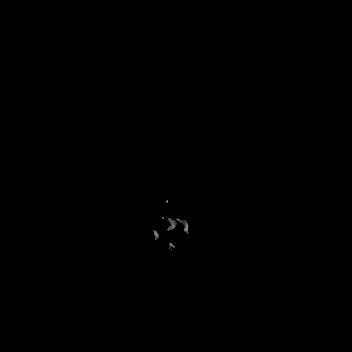
[im 19/38]
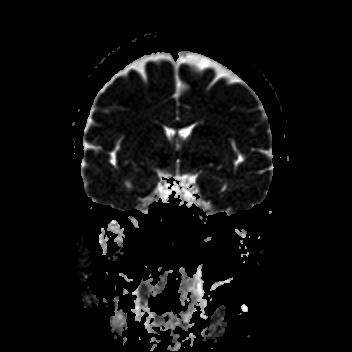
[im 38/38]
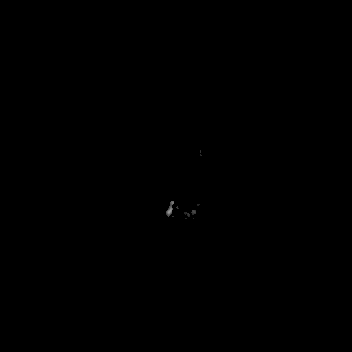

[Series 9: T1 · sagittal · 5.0mm · 0.62mm/px · 2 of 23 slices shown (1 of 2)]
[im 1/23]
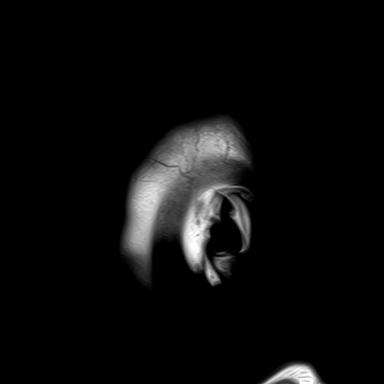
[im 23/23]
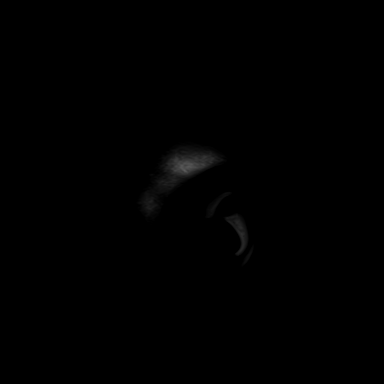

[Series 10: T2 · axial · 5.0mm · 0.53mm/px · z∈[-83,+59]mm · 2 of 25 slices shown (1 of 2)]
[im 1/25]
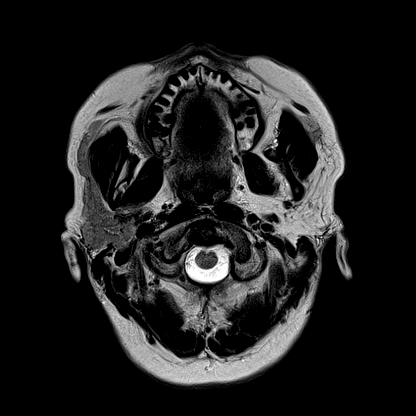
[im 25/25]
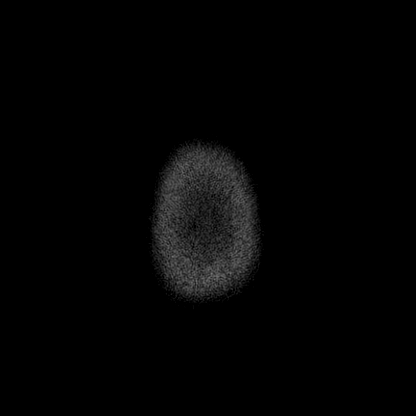

[Series 11: mag_images · axial · 3.0mm · 0.90mm/px · z∈[-99,+76]mm · 4 of 60 slices shown]
[im 1/60]
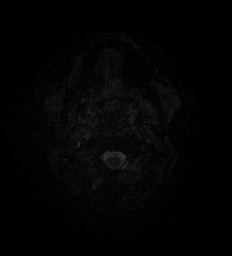
[im 20/60]
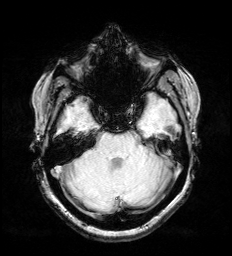
[im 40/60]
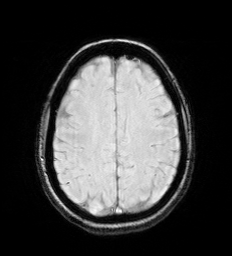
[im 60/60]
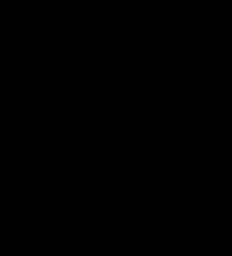

[Series 12: pha_images · axial · 3.0mm · 0.90mm/px · z∈[-99,+76]mm · 4 of 59 slices shown]
[im 1/59]
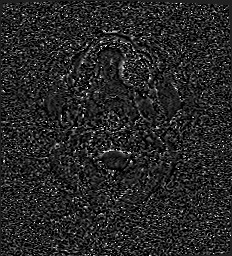
[im 20/59]
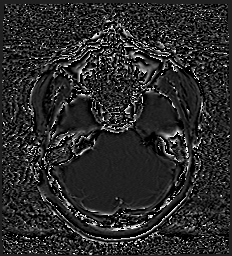
[im 39/59]
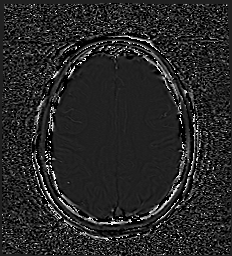
[im 59/59]
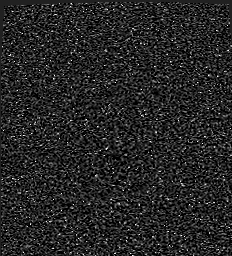

[Series 13: swi_images · axial · 3.0mm · 0.90mm/px · 1 of 60 slices shown]
[im 1/60]
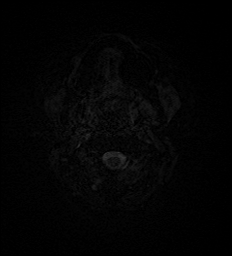

[Series 15: FLAIR · axial · 3.0mm · 0.53mm/px · z∈[-92,+68]mm · 4 of 55 slices shown]
[im 1/55]
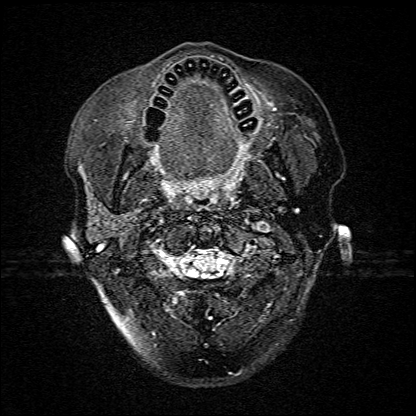
[im 19/55]
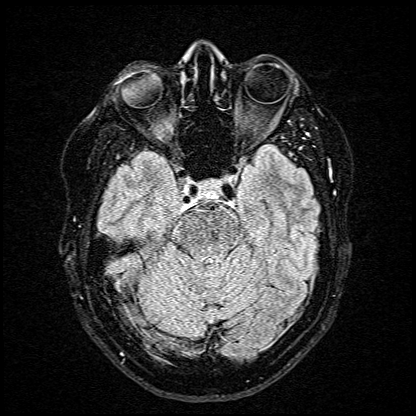
[im 37/55]
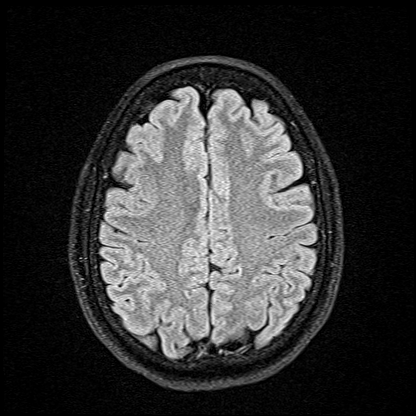
[im 55/55]
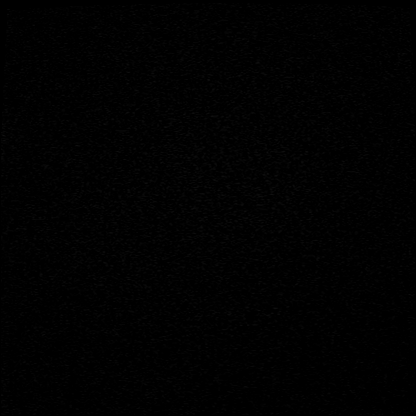

[Series 16: T1 · axial · 1.0mm · 0.98mm/px · z∈[-100,+73]mm · 8 of 176 slices shown (2 of 2)]
[im 1/176]
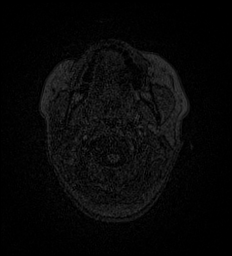
[im 30/176]
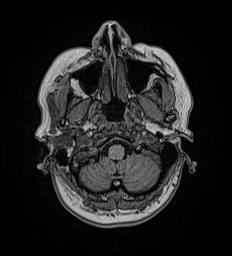
[im 59/176]
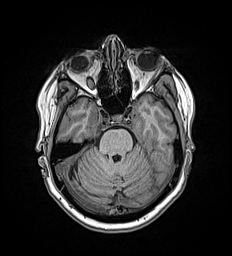
[im 73/176]
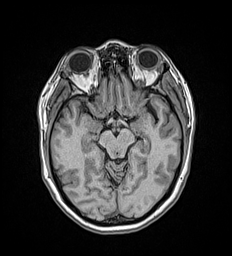
[im 103/176]
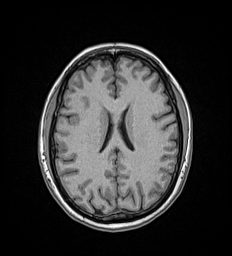
[im 117/176]
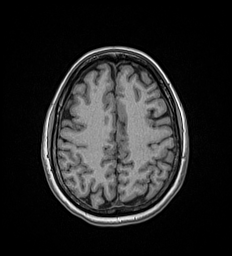
[im 146/176]
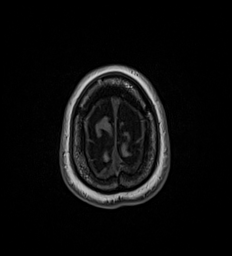
[im 176/176]
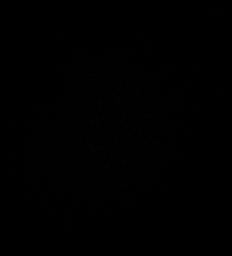

[Series 17: T2 · coronal · 5.0mm · 0.57mm/px · 2 of 29 slices shown (2 of 2)]
[im 1/29]
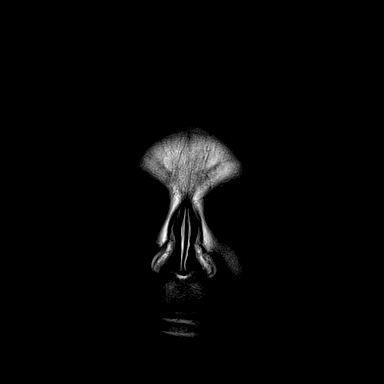
[im 29/29]
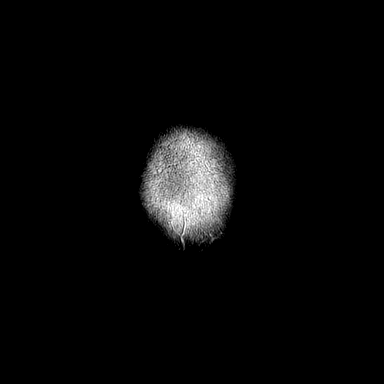

[40 of 48 positions shown; findings below may reference images not displayed]

FINDINGS: MRI HEAD FINDINGS

Brain:

Cerebral volume is normal.

No cortical encephalomalacia is identified. No significant cerebral
white matter disease.

There is no acute infarct.

No evidence of an intracranial mass.

No chronic intracranial blood products.

No extra-axial fluid collection.

No midline shift.

Vascular: Maintained flow voids within the proximal large arterial
vessels.

Skull and upper cervical spine: No focal suspicious marrow lesion.

Sinuses/Orbits: No mass or acute finding within the imaged orbits.
Trace mucosal thickening within the bilateral ethmoid sinuses.

MRI CERVICAL SPINE FINDINGS

Mild intermittent motion degradation.

Alignment: Straightening of the expected cervical lordosis. Trace
grade 1 anterolisthesis at C3-C4. 2 mm grade 1 anterolisthesis at
C4-C5.

Vertebrae: Vertebral body height is maintained. No significant
marrow edema or focal suspicious osseous lesion.

Cord: No signal abnormality within the visualized distal spinal
cord.

Posterior Fossa, vertebral arteries, paraspinal tissues: No
abnormality identified within included portions of the posterior
fossa. Flow voids preserved within the imaged cervical vertebral
arteries. No paraspinal mass or collection.

Disc levels:

Mild-to-moderate disc degeneration at C6-C7. No more than mild disc
degeneration at the remaining levels.

C2-C3: Facet arthrosis. No significant disc herniation or spinal
canal stenosis.

C3-C4: Trace grade 1 anterolisthesis. Slight disc uncovering. Mild
bilateral uncovertebral hypertrophy (greater on the left). Facet
arthrosis (greater on the left). No significant disc herniation or
spinal canal stenosis. Mild relative left neural foraminal
narrowing.

C4-C5: 2 mm grade 1 anterolisthesis. Mild disc uncovering. Mild
uncovertebral hypertrophy on the right. Facet arthrosis (greater on
the right). No significant spinal canal or foraminal stenosis.
Minimal

C5-C6: Disc bulge. Superimposed shallow broad-based central disc
protrusion. Right-sided disc osteophyte ridge/uncinate hypertrophy.
Uncovertebral hypertrophy also present on the left. The disc
protrusion contributes to mild spinal canal narrowing and contacts
the ventral aspect of the spinal cord. Mild right neural foraminal
narrowing.

C6-C7: Shallow disc bulge. Superimposed left foraminal disc
protrusion (series 9, image 11). Bilateral uncovertebral
hypertrophy. Minimal facet arthrosis. No significant spinal canal
stenosis. Moderate/severe left neural foraminal narrowing.

C7-T1: No significant disc herniation or stenosis.
IMPRESSION: MRI brain:

Unremarkable non-contrast MRI appearance of the brain. No evidence
of acute intracranial abnormality.

MRI cervical spine:

1. Cervical spondylosis, as outlined and with findings most notably
as follows.
2. At C6-C7, a left foraminal disc protrusion contributes to
moderate/severe left neural foraminal narrowing. Correlate for left
C7 radiculopathy.
3. At C5-C6, a broad-based central disc protrusion contributes to
mild spinal canal narrowing and contacts the ventral spinal cord.
4. Additional sites of mild foraminal stenosis, as detailed.
5. Disc degeneration is greatest at C6-C7 (mild-to-moderate at this
level).
6. Nonspecific straightening of the expected cervical lordosis.
7. Mild grade 1 anterolisthesis at C3-C4 and C4-C5.
8. No lesions identified within the cervical spinal cord.

## 2022-06-12 MED ORDER — PREDNISONE 10 MG PO TABS: ORAL_TABLET | ORAL | 0 refills | Status: AC

## 2022-06-12 NOTE — Telephone Encounter (Addendum)
Pt called in stating that her left arm is numb... Pt stated that her hand is swallowing and finger tips are numb and tingling... Pt stated that she thinks it maybe a pinch nerve.... Pt stated that the numbness is going down into her left leg... Pt stated that she wants to see Dr. Derrel Nip first and have Dr. Derrel Nip refer her to someone... No avail appt.... Transfer pt to Access Nurse.Marland KitchenMarland Kitchen

## 2022-06-12 NOTE — ED Provider Notes (Signed)
[facility] Provider Note    Event Date/Time   First MD Initiated Contact with Patient 06/12/22 1111     (approximate)   History   Chief Complaint Numbness and Shortness of Breath   HPI  Caroline Miles is a 56 y.o. female with past medical history of hypertension and generalized anxiety disorder who presents to the ED complaining of numbness.  Patient reports that for the past month she has been dealing with numbness radiating down her left arm into her left index finger.  She states that she occasionally has discomfort in the left side of her neck which radiates down the arm as well.  She has not had any weakness in the arm, does state the numbness has seemed to get more severe over the past 24 hours, particularly in her index finger.  She has not had any trauma to her neck or arm.  She is also concerned that because around 930 this morning she developed numbness in her left leg and her right arm, denies any symptoms affecting her right leg.  She has not had any weakness in either of these extremities and denies any vision changes or speech changes.  She does state that she has been "feeling off" with generalized malaise for the past 24 hours.  She additionally endorses feeling short of breath chronically when she goes to bed at night, slightly worse over the past 24 hours.  She denies any associated fevers, cough, or chest pain.     Physical Exam   Triage Vital Signs: ED Triage Vitals  Enc Vitals Group     BP 06/12/22 1029 (!) 143/91     Pulse Rate 06/12/22 1029 60     Resp 06/12/22 1029 18     Temp 06/12/22 1029 98 F (36.7 C)     Temp Source 06/12/22 1029 Oral     SpO2 06/12/22 1029 96 %     Weight 06/12/22 1028 154 lb (69.9 kg)     Height 06/12/22 1028 '5\' 6"'$  (1.676 m)     Head Circumference --      Peak Flow --      Pain Score 06/12/22 1028 0     Pain Loc --      Pain Edu? --      Excl. in [place]? --     Most recent vital signs: Vitals:   06/12/22  1135 06/12/22 1138  BP: (!) 145/73   Pulse: 60 60  Resp: 18   Temp:    SpO2: 100% 100%    Constitutional: Alert and oriented. Eyes: Conjunctivae are normal. Head: Atraumatic. Nose: No congestion/rhinnorhea. Mouth/Throat: Mucous membranes are moist.  Neck: No midline cervical spine tenderness to palpation. Cardiovascular: Normal rate, regular rhythm. Grossly normal heart sounds.  2+ radial pulses bilaterally. Respiratory: Normal respiratory effort.  No retractions. Lungs CTAB. Gastrointestinal: Soft and nontender. No distention. Musculoskeletal: No lower extremity tenderness nor edema.  Neurologic:  Normal speech and language. No gross focal neurologic deficits are appreciated.    ED Results / Procedures / Treatments   Labs (all labs ordered are listed, but only abnormal results are displayed) Labs Reviewed  BASIC METABOLIC PANEL - Abnormal; Notable for the following components:      Result Value   Glucose, Bld 103 (*)    All other components within normal limits  URINALYSIS, ROUTINE W REFLEX MICROSCOPIC - Abnormal; Notable for the following components:   Color, Urine YELLOW (*)    APPearance CLEAR (*)  Hgb urine dipstick SMALL (*)    All other components within normal limits  CBC  PREGNANCY, URINE  TROPONIN I (HIGH SENSITIVITY)     EKG  ED ECG REPORT I, Blake Divine, the attending physician, personally viewed and interpreted this ECG.   Date: 06/12/2022  EKG Time: 10:27  Rate: 61  Rhythm: normal sinus rhythm  Axis: Normal  Intervals:none  ST&T Change: None  RADIOLOGY CT head reviewed and interpreted by me with no hemorrhage or midline shift.  PROCEDURES:  Critical Care performed: No  Procedures   MEDICATIONS ORDERED IN ED: Medications - No data to display   IMPRESSION / MDM / [place] / ED COURSE  I reviewed the triage vital signs and the nursing notes.                              56 y.o. female with past medical history of  hypertension and generalized anxiety disorder who presents to the ED complaining of 1 month of numbness and pain radiating down her left arm, now with a couple of hours of numbness in her left leg and right arm without weakness.  Patient's presentation is most consistent with acute presentation with potential threat to life or bodily function.  Differential diagnosis includes, but is not limited to, stroke, TIA, cervical radiculopathy, cervical myelopathy, electrolyte abnormality, anxiety.  Patient well-appearing and in no acute distress, vital signs are unremarkable and she does not have any focal neurologic deficits on exam.  With numbness extending to both sides of her body, low suspicion for acute stroke and symptoms seem most consistent with a cervical radiculopathy.  CT imaging is unremarkable but we will further assess with MRI of her brain and cervical spine.  Pregnancy testing is negative, urinalysis without signs of infection.  Labs are reassuring with no significant anemia or electrolyte abnormality.  MRI brain is negative for acute process, no evidence of stroke.  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  This correlates with patient's pain and numbness moving down her left arm, particularly affecting her index finger.  There is no significant central cord stenosis to explain her additional symptoms.  Given otherwise reassuring work-up, she is appropriate for discharge home with outpatient neurosurgery and PCP follow-up.  We will start her on a steroid taper and she was counseled to be careful about her diet due to risk of hyperglycemia while on steroids.  She was counseled to return to the ED for new worsening symptoms, patient agrees with plan.      FINAL CLINICAL IMPRESSION(S) / ED DIAGNOSES   Final diagnoses:  Cervical radiculopathy  Facial numbness     Rx / DC Orders   ED Discharge Orders          Ordered    predniSONE  (DELTASONE) 10 MG tablet  Daily        06/12/22 1419             Note:  This document was prepared using Dragon voice recognition software and may include unintentional dictation errors.   Blake Divine, MD 06/12/22 1421

## 2022-06-12 NOTE — ED Triage Notes (Signed)
Pt here with numbness and SOB. Pt states 4 week ago she had numbness and tingling in her left arm. Pt states her index finger is almost completely numb with numbness to both legs currently. LKW Sunday. Pt states she has not felt like herself in a while.

## 2022-06-12 NOTE — Telephone Encounter (Signed)
Pt is currently at the ED

## 2022-06-20 ENCOUNTER — Other Ambulatory Visit: Payer: Self-pay | Admitting: Family Medicine

## 2022-06-20 DIAGNOSIS — M5412 Radiculopathy, cervical region: Secondary | ICD-10-CM

## 2022-06-29 ENCOUNTER — Ambulatory Visit
Admission: RE | Admit: 2022-06-29 | Discharge: 2022-06-29 | Disposition: A | Discharge status: Home / Self Care | Payer: 59 | Source: Ambulatory Visit | Attending: Family Medicine | Admitting: Family Medicine

## 2022-06-29 DIAGNOSIS — M5412 Radiculopathy, cervical region: Secondary | ICD-10-CM

## 2022-06-29 MED ORDER — TRIAMCINOLONE ACETONIDE 40 MG/ML IJ SUSP (RADIOLOGY)
60.0000 mg | Freq: Once | INTRAMUSCULAR | Status: AC
  Administered 2022-06-29: 60 mg via EPIDURAL

## 2022-06-29 MED ORDER — IOPAMIDOL (ISOVUE-M 300) INJECTION 61%
1.0000 mL | Freq: Once | INTRAMUSCULAR | Status: AC
  Administered 2022-06-29: 1 mL via EPIDURAL

## 2022-06-29 NOTE — Discharge Instructions (Signed)

## 2022-07-17 ENCOUNTER — Ambulatory Visit
Admission: RE | Admit: 2022-07-17 | Discharge: 2022-07-17 | Disposition: A | Discharge status: Home / Self Care | Payer: 59 | Source: Ambulatory Visit | Attending: Internal Medicine | Admitting: Internal Medicine

## 2022-07-17 DIAGNOSIS — Z1231 Encounter for screening mammogram for malignant neoplasm of breast: Secondary | ICD-10-CM | POA: Insufficient documentation

## 2023-01-18 ENCOUNTER — Telehealth: Payer: 59 | Admitting: Emergency Medicine

## 2023-01-18 DIAGNOSIS — J029 Acute pharyngitis, unspecified: Secondary | ICD-10-CM

## 2023-01-18 DIAGNOSIS — R052 Subacute cough: Secondary | ICD-10-CM

## 2023-01-18 MED ORDER — BENZONATATE 100 MG PO CAPS: 100.0000 mg | ORAL_CAPSULE | Freq: Two times a day (BID) | ORAL | 0 refills | Status: DC | PRN

## 2023-01-18 MED ORDER — AMOXICILLIN-POT CLAVULANATE 875-125 MG PO TABS: 1.0000 | ORAL_TABLET | Freq: Two times a day (BID) | ORAL | 0 refills | Status: DC

## 2023-01-18 NOTE — Progress Notes (Signed)
We are sorry that you are not feeling well.  Here is how we plan to help!  Based on your presentation I believe you most likely have A cough due to bacteria.  When patients have a fever and a productive cough with a change in color or increased sputum production, we are concerned about bacterial bronchitis.  If left untreated it can progress to pneumonia.  If your symptoms do not improve with your treatment plan it is important that you contact your provider.  I've prescribe Augmentin, which should take care of the cough, sore throat, and ear pain.   In addition you may use A prescription cough medication called Tessalon Perles '100mg'$ . You may take 1-2 capsules every 8 hours as needed for your cough.    From your responses in the eVisit questionnaire you describe inflammation in the upper respiratory tract which is causing a significant cough.  This is commonly called Bronchitis and has four common causes:   Allergies Viral Infections Acid Reflux Bacterial Infection Allergies, viruses and acid reflux are treated by controlling symptoms or eliminating the cause. An example might be a cough caused by taking certain blood pressure medications. You stop the cough by changing the medication. Another example might be a cough caused by acid reflux. Controlling the reflux helps control the cough.      HOME CARE Only take medications as instructed by your medical team. Complete the entire course of an antibiotic. Drink plenty of fluids and get plenty of rest. Avoid close contacts especially the very young and the elderly Cover your mouth if you cough or cough into your sleeve. Always remember to wash your hands A steam or ultrasonic humidifier can help congestion.   GET HELP RIGHT AWAY IF: You develop worsening fever. You become short of breath You cough up blood. Your symptoms persist after you have completed your treatment plan MAKE SURE YOU  Understand these instructions. Will watch your  condition. Will get help right away if you are not doing well or get worse.    Thank you for choosing an e-visit.  Your e-visit answers were reviewed by a board certified advanced clinical practitioner to complete your personal care plan. Depending upon the condition, your plan could have included both over the counter or prescription medications.  Please review your pharmacy choice. Make sure the pharmacy is open so you can pick up prescription now. If there is a problem, you may contact your provider through [employer] and have the prescription routed to another pharmacy.  Your safety is important to [place]. If you have drug allergies check your prescription carefully.   For the next 24 hours you can use MyChart to ask questions about today's visit, request a non-urgent call back, or ask for a work or school excuse. You will get an email in the next two days asking about your experience. I hope that your e-visit has been valuable and will speed your recovery.  Approximately 5 minutes was used in reviewing the patient's chart, questionnaire, prescribing medications, and documentation.

## 2023-04-23 ENCOUNTER — Other Ambulatory Visit: Payer: Self-pay

## 2023-04-23 MED ORDER — CITALOPRAM HYDROBROMIDE 20 MG PO TABS: ORAL_TABLET | ORAL | 2 refills | Status: DC

## 2023-05-21 ENCOUNTER — Other Ambulatory Visit: Payer: Self-pay

## 2023-05-21 MED ORDER — HYDROCHLOROTHIAZIDE 25 MG PO TABS: 25.0000 mg | ORAL_TABLET | Freq: Every day | ORAL | 3 refills | Status: DC

## 2023-06-10 ENCOUNTER — Ambulatory Visit (INDEPENDENT_AMBULATORY_CARE_PROVIDER_SITE_OTHER): Payer: 59 | Admitting: Internal Medicine

## 2023-06-10 ENCOUNTER — Other Ambulatory Visit (HOSPITAL_COMMUNITY)
Admission: RE | Admit: 2023-06-10 | Discharge: 2023-06-10 | Disposition: A | Discharge status: Home / Self Care | Payer: 59 | Source: Ambulatory Visit | Attending: Internal Medicine | Admitting: Internal Medicine

## 2023-06-10 ENCOUNTER — Other Ambulatory Visit: Payer: Self-pay | Admitting: Internal Medicine

## 2023-06-10 ENCOUNTER — Encounter: Payer: Self-pay | Admitting: Internal Medicine

## 2023-06-10 VITALS — BP 128/66 | HR 61 | Temp 97.8°F | Ht 66.0 in | Wt 156.2 lb

## 2023-06-10 DIAGNOSIS — Z113 Encounter for screening for infections with a predominantly sexual mode of transmission: Secondary | ICD-10-CM

## 2023-06-10 DIAGNOSIS — E785 Hyperlipidemia, unspecified: Secondary | ICD-10-CM | POA: Diagnosis not present

## 2023-06-10 DIAGNOSIS — R2231 Localized swelling, mass and lump, right upper limb: Secondary | ICD-10-CM | POA: Diagnosis not present

## 2023-06-10 DIAGNOSIS — R2232 Localized swelling, mass and lump, left upper limb: Secondary | ICD-10-CM | POA: Diagnosis not present

## 2023-06-10 DIAGNOSIS — R7303 Prediabetes: Secondary | ICD-10-CM

## 2023-06-10 DIAGNOSIS — Z0001 Encounter for general adult medical examination with abnormal findings: Secondary | ICD-10-CM

## 2023-06-10 DIAGNOSIS — N9411 Superficial (introital) dyspareunia: Secondary | ICD-10-CM

## 2023-06-10 DIAGNOSIS — R5383 Other fatigue: Secondary | ICD-10-CM

## 2023-06-10 DIAGNOSIS — Z124 Encounter for screening for malignant neoplasm of cervix: Secondary | ICD-10-CM | POA: Diagnosis present

## 2023-06-10 DIAGNOSIS — Z0000 Encounter for general adult medical examination without abnormal findings: Secondary | ICD-10-CM

## 2023-06-10 DIAGNOSIS — Z1231 Encounter for screening mammogram for malignant neoplasm of breast: Secondary | ICD-10-CM

## 2023-06-10 DIAGNOSIS — I10 Essential (primary) hypertension: Secondary | ICD-10-CM | POA: Diagnosis not present

## 2023-06-10 DIAGNOSIS — R635 Abnormal weight gain: Secondary | ICD-10-CM

## 2023-06-10 LAB — COMPREHENSIVE METABOLIC PANEL
ALT: 17 U/L (ref 0–35)
AST: 25 U/L (ref 0–37)
Albumin: 4.6 g/dL (ref 3.5–5.2)
Alkaline Phosphatase: 58 U/L (ref 39–117)
BUN: 12 mg/dL (ref 6–23)
CO2: 29 mEq/L (ref 19–32)
Calcium: 9.6 mg/dL (ref 8.4–10.5)
Chloride: 102 mEq/L (ref 96–112)
Creatinine, Ser: 0.86 mg/dL (ref 0.40–1.20)
GFR: 76.04 mL/min (ref >60.00)
Glucose, Bld: 99 mg/dL (ref 70–99)
Potassium: 4.3 mEq/L (ref 3.5–5.1)
Sodium: 138 mEq/L (ref 135–145)
Total Bilirubin: 0.7 mg/dL (ref 0.2–1.2)
Total Protein: 7.7 g/dL (ref 6.0–8.3)

## 2023-06-10 LAB — CBC WITH DIFFERENTIAL/PLATELET
Basophils Absolute: 0 10*3/uL (ref 0.0–0.1)
Basophils Relative: 0.8 % (ref 0.0–3.0)
Eosinophils Absolute: 0.2 10*3/uL (ref 0.0–0.7)
Eosinophils Relative: 3.7 % (ref 0.0–5.0)
HCT: 38.9 % (ref 36.0–46.0)
Hemoglobin: 12.7 g/dL (ref 12.0–15.0)
Lymphocytes Relative: 27 % (ref 12.0–46.0)
Lymphs Abs: 1.4 10*3/uL (ref 0.7–4.0)
MCHC: 32.7 g/dL (ref 30.0–36.0)
MCV: 90.3 fl (ref 78.0–100.0)
Monocytes Absolute: 0.3 10*3/uL (ref 0.1–1.0)
Monocytes Relative: 6.5 % (ref 3.0–12.0)
Neutro Abs: 3.2 10*3/uL (ref 1.4–7.7)
Neutrophils Relative %: 62 % (ref 43.0–77.0)
Platelets: 335 10*3/uL (ref 150.0–400.0)
RBC: 4.31 Mil/uL (ref 3.87–5.11)
RDW: 13.9 % (ref 11.5–15.5)
WBC: 5.2 10*3/uL (ref 4.0–10.5)

## 2023-06-10 LAB — LIPID PANEL
Cholesterol: 169 mg/dL (ref 0–200)
HDL: 40.9 mg/dL (ref >39.00)
LDL Cholesterol: 102 mg/dL — ABNORMAL HIGH (ref 0–99)
NonHDL: 127.7
Total CHOL/HDL Ratio: 4
Triglycerides: 129 mg/dL (ref 0.0–149.0)
VLDL: 25.8 mg/dL (ref 0.0–40.0)

## 2023-06-10 LAB — LDL CHOLESTEROL, DIRECT: Direct LDL: 121 mg/dL

## 2023-06-10 LAB — TSH: TSH: 2.23 u[IU]/mL (ref 0.35–5.50)

## 2023-06-10 LAB — MICROALBUMIN / CREATININE URINE RATIO
Creatinine,U: 57.7 mg/dL
Microalb Creat Ratio: 1.2 mg/g (ref 0.0–30.0)
Microalb, Ur: <0.7 mg/dL (ref 0.0–1.9)

## 2023-06-10 LAB — HEMOGLOBIN A1C: Hgb A1c MFr Bld: 6.2 % (ref 4.6–6.5)

## 2023-06-10 MED ORDER — ZOSTER VAC RECOMB ADJUVANTED 50 MCG/0.5ML IM SUSR: 0.5000 mL | Freq: Once | INTRAMUSCULAR | 1 refills | Status: AC

## 2023-06-10 MED ORDER — VAGIFEM 10 MCG VA TABS: ORAL_TABLET | VAGINAL | 2 refills | Status: DC

## 2023-06-10 MED ORDER — CITALOPRAM HYDROBROMIDE 20 MG PO TABS
ORAL_TABLET | ORAL | 2 refills | Status: DC
Start: 2023-06-10 — End: 2024-02-25

## 2023-06-10 NOTE — Progress Notes (Unsigned)
Patient ID: Caroline Miles, female    DOB: 1966-10-30  Age: 57 y.o. MRN: [id number]  The patient is here for annual preventive examination and management of other chronic and acute problems.   The risk factors are reflected in the social history.   The roster of all physicians providing medical care to patient - is listed in the Snapshot section of the chart.   Activities of daily living:  The patient is 100% independent in all ADLs: dressing, toileting, feeding as well as independent mobility   Home safety : The patient has smoke detectors in the home. They wear seatbelts.  There are no unsecured firearms at home. There is no violence in the home.    There is no risks for hepatitis, STDs or HIV. There is no   history of blood transfusion. They have no travel history to infectious disease endemic areas of the world.   The patient has seen their dentist in the last six month. They have seen their eye doctor in the last year. The patinet  denies slight hearing difficulty with regard to whispered voices and some television programs.  They have deferred audiologic testing in the last year.  They do not  have excessive sun exposure. Discussed the need for sun protection: hats, long sleeves and use of sunscreen if there is significant sun exposure.    Diet: the importance of a healthy diet is discussed. They do have a healthy diet.   The benefits of regular aerobic exercise were discussed. The patient  exercises  3 to 5 days per week  for  60 minutes.    Depression screen: there are no signs or vegative symptoms of depression- irritability, change in appetite, anhedonia, sadness/tearfullness.   The following portions of the patient's history were reviewed and updated as appropriate: allergies, current medications, past family history, past medical history,  past surgical history, past social history  and problem list.   Visual acuity was not assessed per patient preference since the patient has regular  follow up with an  ophthalmologist. Hearing and body mass index were assessed and reviewed.    During the course of the visit the patient was educated and counseled about appropriate screening and preventive services including : fall prevention , diabetes screening, nutrition counseling, colorectal cancer screening, and recommended immunizations.    Chief Complaint:  1) decreased libido.  Vaginal dryness.  Causing marital tension   2) fingers and toes swelling . Does not cook with salt or add salt.  Taking hydrochlorothiazide   3) Hypertension: patient checks blood pressure twice weekly at home.  Readings have been for the most part < 140/80 at rest . Patient is following a reduce salt diet most days and is taking medications as prescribed   Review of Symptoms  Patient denies headache, fevers, malaise, unintentional weight loss, skin rash, eye pain, sinus congestion and sinus pain, sore throat, dysphagia,  hemoptysis , cough, dyspnea, wheezing, chest pain, palpitations, orthopnea, edema, abdominal pain, nausea, melena, diarrhea, constipation, flank pain, dysuria, hematuria, urinary  Frequency, nocturia, numbness, tingling, seizures,  Focal weakness, Loss of consciousness,  Tremor, insomnia, depression, anxiety, and suicidal ideation.    Physical Exam:  BP 128/66   Pulse 61   Temp 97.8 F (36.6 C) (Oral)   Ht 5\' 6"  (1.676 m)   Wt 156 lb 3.2 oz (70.9 kg)   LMP  (LMP Unknown)   SpO2 98%   BMI 25.21 kg/m    Physical Exam Vitals reviewed.  Constitutional:  General: She is not in acute distress.    Appearance: Normal appearance. She is well-developed and normal weight. She is not ill-appearing, toxic-appearing or diaphoretic.  HENT:     Head: Normocephalic.     Right Ear: Tympanic membrane, ear canal and external ear normal. There is no impacted cerumen.     Left Ear: Tympanic membrane, ear canal and external ear normal. There is no impacted cerumen.     Nose: Nose normal.      Mouth/Throat:     Mouth: Mucous membranes are moist.     Pharynx: Oropharynx is clear.  Eyes:     General: No scleral icterus.       Right eye: No discharge.        Left eye: No discharge.     Conjunctiva/sclera: Conjunctivae normal.     Pupils: Pupils are equal, round, and reactive to light.  Neck:     Thyroid: No thyromegaly.     Vascular: No carotid bruit or JVD.  Cardiovascular:     Rate and Rhythm: Normal rate and regular rhythm.     Heart sounds: Normal heart sounds.  Pulmonary:     Effort: Pulmonary effort is normal. No respiratory distress.     Breath sounds: Normal breath sounds.  Chest:  Breasts:    Breasts are symmetrical.     Right: Normal. No swelling, inverted nipple, mass, nipple discharge, skin change or tenderness.     Left: Normal. No swelling, inverted nipple, mass, nipple discharge, skin change or tenderness.  Abdominal:     General: Bowel sounds are normal.     Palpations: Abdomen is soft. There is no mass.     Tenderness: There is no abdominal tenderness. There is no guarding or rebound.     Hernia: There is no hernia in the left inguinal area or right inguinal area.  Genitourinary:    General: Normal vulva.     Exam position: Lithotomy position.     Pubic Area: No rash or pubic lice.      Labia:        Right: No rash, tenderness, lesion or injury.        Left: No rash, tenderness, lesion or injury.      Vagina: Normal.     Cervix: Normal.     Uterus: Normal.      Adnexa: Right adnexa normal and left adnexa normal.     Comments: Atrophic changes noted to vaginal vault without lesions  Musculoskeletal:        General: Swelling present. Normal range of motion.     Right hand: Swelling present.     Left hand: Swelling present.     Cervical back: Normal range of motion and neck supple.     Comments: Uniform swelling of fingers bilaterally without synovitis or scleroderma like changes   Lymphadenopathy:     Cervical: No cervical adenopathy.     Upper  Body:     Right upper body: No supraclavicular, axillary or pectoral adenopathy.     Left upper body: No supraclavicular, axillary or pectoral adenopathy.     Lower Body: No right inguinal adenopathy. No left inguinal adenopathy.  Skin:    General: Skin is warm and dry.  Neurological:     General: No focal deficit present.     Mental Status: She is alert and oriented to person, place, and time. Mental status is at baseline.  Psychiatric:        Mood and Affect: Mood  normal.        Behavior: Behavior normal.        Thought Content: Thought content normal.        Judgment: Judgment normal.    Assessment and Plan: Encounter for screening mammogram for malignant neoplasm of breast -     3D Screening Mammogram, Left and Right; Future  Cervical cancer screening -     Cytology - PAP  Essential hypertension Assessment & Plan: Managed with hydrochlorothiazide. Hands and feet have been swelling with no synovitis on exam/.  Checking urine for protein.  Will change diuretic to furosemide    Pre-diabetes -     Comprehensive metabolic panel -     Hemoglobin A1c  Hyperlipidemia, unspecified hyperlipidemia type -     Lipid panel -     LDL cholesterol, direct  Other fatigue -     TSH -     CBC with Differential/Platelet  Localized swelling of finger of both hands Assessment & Plan: Without synovitis or changes c/w scleroderma .  Checking thyroid and screening for nephrotic syndrome  Orders: -     Microalbumin / creatinine urine ratio  Screen for STD (sexually transmitted disease) -     HIV Antibody (routine testing w rflx) -     Hepatitis C antibody  Introital dyspareunia Assessment & Plan: Secondary to menopausal changes to vaginal tissue and mild vaginal stenosis.  Estrogen vaginal tablets prescribed (cream was not covered by insurance) and advised to consider use of dilators    Weight gain Assessment & Plan: I have  recommended a low glycemic index diet utilizing smaller  more frequent meals to increase metabolism.  I have also recommended that patient start exercising with a goal of 30 minutes of aerobic exercise a minimum of 5 days per week.   Encounter for general adult medical examination with abnormal findings Assessment & Plan: age appropriate education and counseling updated, referrals for preventative services and immunizations addressed, dietary and smoking counseling addressed, most recent labs reviewed.  I have personally reviewed and have noted:   1) the patient's medical and social history 2) The pt's use of alcohol, tobacco, and illicit drugs 3) The patient's current medications and supplements 4) Functional ability including ADL's, fall risk, home safety risk, hearing and visual impairment 5) Diet and physical activities 6) Evidence for depression or mood disorder 7) The patient's height, weight, and BMI have been recorded in the chart   I have made referrals, and provided counseling and education based on review of the above    Other orders -     Zoster Vac Recomb Adjuvanted; Inject 0.5 mLs into the muscle once for 1 dose.  Dispense: 1 each; Refill: 1 -     Vagifem; 1 TABLET INTRAVAGINALLY FOR 14 DAYS, THEN TWICE WEEKLY THEREAFTER  Dispense: 18 tablet; Refill: 2 -     Citalopram Hydrobromide; TAKE 1 TABLET(20 MG) BY MOUTH DAILY  Dispense: 90 tablet; Refill: 2    No follow-ups on file.  Sherlene Shams, MD

## 2023-06-10 NOTE — Patient Instructions (Addendum)
Vaginal dryness can happen after menopause due to lack of estrogen   I am prescribing vaginal estrogen therapy to "turn the clock back"  .  Place the estrogen tablet in your vagina every night (AFTER intercourse!  )  for 14 NIGHTS.  After that, reduce use to 2 times per week  You may need to Use OTC [place] vaginal lubricant  for the first few weeks (DURING INTERCOURSE) to help reduce friction caused by menopause

## 2023-06-10 NOTE — Assessment & Plan Note (Signed)
Managed with hydrochlorothiazide. Hands and feet swelling.  Checking urine for protein.  Will change to furosemide

## 2023-06-11 DIAGNOSIS — N9411 Superficial (introital) dyspareunia: Secondary | ICD-10-CM | POA: Insufficient documentation

## 2023-06-11 DIAGNOSIS — R2231 Localized swelling, mass and lump, right upper limb: Secondary | ICD-10-CM | POA: Insufficient documentation

## 2023-06-11 DIAGNOSIS — Z0001 Encounter for general adult medical examination with abnormal findings: Secondary | ICD-10-CM | POA: Insufficient documentation

## 2023-06-11 LAB — CYTOLOGY - PAP
Adequacy: ABSENT
Comment: NEGATIVE
Diagnosis: NEGATIVE
High risk HPV: NEGATIVE

## 2023-06-11 NOTE — Assessment & Plan Note (Signed)
Without synovitis or changes c/w scleroderma .  Checking thyroid and screening for nephrotic syndrome

## 2023-06-11 NOTE — Assessment & Plan Note (Signed)
I have  recommended a low glycemic index diet utilizing smaller more frequent meals to increase metabolism.  I have also recommended that patient start exercising with a goal of 30 minutes of aerobic exercise a minimum of 5 days per week.  

## 2023-06-11 NOTE — Assessment & Plan Note (Signed)

## 2023-06-11 NOTE — Assessment & Plan Note (Signed)
Secondary to menopausal changes to vaginal tissue and mild vaginal stenosis.  Estrogen vaginal tablets prescribed (cream was not covered by insurance) and advised to consider use of dilators

## 2023-06-12 LAB — HEPATITIS C ANTIBODY: Hepatitis C Ab: NONREACTIVE

## 2023-06-12 LAB — HIV ANTIBODY (ROUTINE TESTING W REFLEX): HIV 1&2 Ab, 4th Generation: NONREACTIVE

## 2023-07-25 ENCOUNTER — Ambulatory Visit
Admission: RE | Admit: 2023-07-25 | Discharge: 2023-07-25 | Disposition: A | Discharge status: Home / Self Care | Payer: 59 | Source: Ambulatory Visit | Attending: Internal Medicine | Admitting: Internal Medicine

## 2023-07-25 DIAGNOSIS — Z1231 Encounter for screening mammogram for malignant neoplasm of breast: Secondary | ICD-10-CM | POA: Diagnosis present

## 2023-07-30 ENCOUNTER — Encounter: Payer: Self-pay | Admitting: Internal Medicine

## 2023-08-02 ENCOUNTER — Other Ambulatory Visit: Payer: Self-pay | Admitting: Internal Medicine

## 2023-08-02 MED ORDER — VAGIFEM 10 MCG VA TABS: ORAL_TABLET | VAGINAL | 0 refills | Status: DC

## 2023-11-27 ENCOUNTER — Ambulatory Visit (INDEPENDENT_AMBULATORY_CARE_PROVIDER_SITE_OTHER): Payer: 59 | Admitting: Internal Medicine

## 2023-11-27 ENCOUNTER — Encounter: Payer: Self-pay | Admitting: Internal Medicine

## 2023-11-27 VITALS — BP 130/78 | HR 74 | Ht 66.0 in | Wt 155.0 lb

## 2023-11-27 DIAGNOSIS — I10 Essential (primary) hypertension: Secondary | ICD-10-CM | POA: Diagnosis not present

## 2023-11-27 DIAGNOSIS — N9411 Superficial (introital) dyspareunia: Secondary | ICD-10-CM | POA: Diagnosis not present

## 2023-11-27 DIAGNOSIS — R944 Abnormal results of kidney function studies: Secondary | ICD-10-CM

## 2023-11-27 DIAGNOSIS — R635 Abnormal weight gain: Secondary | ICD-10-CM

## 2023-11-27 DIAGNOSIS — F411 Generalized anxiety disorder: Secondary | ICD-10-CM | POA: Diagnosis not present

## 2023-11-27 MED ORDER — ALPRAZOLAM 0.25 MG PO TABS: 0.2500 mg | ORAL_TABLET | Freq: Two times a day (BID) | ORAL | 2 refills | Status: AC | PRN

## 2023-11-27 MED ORDER — ESTRADIOL 0.1 MG/GM VA CREA: 1.0000 | TOPICAL_CREAM | Freq: Every day | VAGINAL | 12 refills | Status: DC

## 2023-11-27 NOTE — Patient Instructions (Signed)
I am adding alprazolam ,  low dose ,  to help manage your panic attacks .  Continue citalopram as well.  Do not combin alprazolam with alcohol because it is a benzodiazepine (sedative).  Do not take it more than once daily AND DO NOT SHARE WITH ANYONE.  IT IS A CONTROLLED SUBSTANCE  2) I do recommend couples counselling  for you and Molly Maduro ,  and if Molly Maduro won't go,  you should take advantage of getting counselling for yourself   3)  Sex is painful because of 1) dryness and 2) narrowing of your vaginal vault ("stenosis") .  I am prescribing estrogen cream  to help moisturize your vagina  Insert 1 gram intravaginally  using the applicator in the box  .  do this at bedtime daily for 2 weeks,  Then reduce use to twice weekly thereafter   I recommend you start using "dilators"  to help stretch yourself to make intercourse less painful .  Start with the smallest one and leave it in place for as long as you can tolerate it.    If you can't find them online,  I can make a referral to GYN for you

## 2023-11-27 NOTE — Progress Notes (Unsigned)
Subjective:  Patient ID: Caroline Miles, female    DOB: 1966-08-09  Age: 57 y.o. MRN: [id number]  CC: {There were no encounter diagnoses. (Refresh or delete this SmartLink)}   HPI Caroline Miles presents for  Chief Complaint  Patient presents with   Medical Management of Chronic Issues    Depression and anxiety   1) vaginal dryness:  discussed tiral of vaginal cream  2) GAD:  gets anxious at bedtime  because she dreads intercourse due to pain and marital conflict    Outpatient Medications Prior to Visit  Medication Sig Dispense Refill   citalopram (CELEXA) 20 MG tablet TAKE 1 TABLET(20 MG) BY MOUTH DAILY 90 tablet 2   hydrochlorothiazide (HYDRODIURIL) 25 MG tablet Take 1 tablet (25 mg total) by mouth daily. 90 tablet 3   Estradiol (VAGIFEM) 10 MCG TABS vaginal tablet Place 1 tablet in the vagina nightly for 2 weeks,  than twice weekly thereafter (Patient not taking: Reported on 11/27/2023) 18 tablet 0   No facility-administered medications prior to visit.    Review of Systems;  Patient denies headache, fevers, malaise, unintentional weight loss, skin rash, eye pain, sinus congestion and sinus pain, sore throat, dysphagia,  hemoptysis , cough, dyspnea, wheezing, chest pain, palpitations, orthopnea, edema, abdominal pain, nausea, melena, diarrhea, constipation, flank pain, dysuria, hematuria, urinary  Frequency, nocturia, numbness, tingling, seizures,  Focal weakness, Loss of consciousness,  Tremor, insomnia, depression, anxiety, and suicidal ideation.      Objective:  BP 130/78   Pulse 74   Ht 5\' 6"  (1.676 m)   Wt 155 lb (70.3 kg)   LMP  (LMP Unknown)   SpO2 98%   BMI 25.02 kg/m   BP Readings from Last 3 Encounters:  11/27/23 130/78  06/10/23 128/66  06/29/22 (!) 145/73    Wt Readings from Last 3 Encounters:  11/27/23 155 lb (70.3 kg)  06/10/23 156 lb 3.2 oz (70.9 kg)  06/12/22 154 lb (69.9 kg)    Physical Exam  Lab Results  Component Value Date   HGBA1C 6.2  06/10/2023   HGBA1C 6.3 04/11/2022   HGBA1C 6.1 (H) 05/16/2021    Lab Results  Component Value Date   CREATININE 0.86 06/10/2023   CREATININE 0.75 06/12/2022   CREATININE 0.96 05/24/2022    Lab Results  Component Value Date   WBC 5.2 06/10/2023   HGB 12.7 06/10/2023   HCT 38.9 06/10/2023   PLT 335.0 06/10/2023   GLUCOSE 99 06/10/2023   CHOL 169 06/10/2023   TRIG 129.0 06/10/2023   HDL 40.90 06/10/2023   LDLDIRECT 121.0 06/10/2023   LDLCALC 102 (H) 06/10/2023   ALT 17 06/10/2023   AST 25 06/10/2023   NA 138 06/10/2023   K 4.3 06/10/2023   CL 102 06/10/2023   CREATININE 0.86 06/10/2023   BUN 12 06/10/2023   CO2 29 06/10/2023   TSH 2.23 06/10/2023   HGBA1C 6.2 06/10/2023   MICROALBUR <0.7 06/10/2023    MM 3D SCREENING MAMMOGRAM BILATERAL BREAST  Result Date: 07/29/2023 CLINICAL DATA:  Screening. EXAM: DIGITAL SCREENING BILATERAL MAMMOGRAM WITH TOMOSYNTHESIS AND CAD TECHNIQUE: Bilateral screening digital craniocaudal and mediolateral oblique mammograms were obtained. Bilateral screening digital breast tomosynthesis was performed. The images were evaluated with computer-aided detection. COMPARISON:  Previous exam(s). ACR Breast Density Category c: The breasts are heterogeneously dense, which may obscure small masses. FINDINGS: There are no findings suspicious for malignancy. IMPRESSION: No mammographic evidence of malignancy. A result letter of this screening mammogram will be mailed directly to  the patient. RECOMMENDATION: Screening mammogram in one year. (Code:SM-B-01Y) BI-RADS CATEGORY  1: Negative. Electronically Signed   By: Baird Lyons M.D.   On: 07/29/2023 08:07    Assessment & Plan:  .There are no diagnoses linked to this encounter.   I provided 30 minutes of face-to-face time during this encounter reviewing patient's last visit with me, patient's  most recent visit with cardiology,  nephrology,  and neurology,  recent surgical and non surgical procedures, previous   labs and imaging studies, counseling on currently addressed issues,  and post visit ordering to diagnostics and therapeutics .   Follow-up: No follow-ups on file.   Sherlene Shams, MD

## 2023-11-28 LAB — BASIC METABOLIC PANEL
BUN: 14 mg/dL (ref 6–23)
CO2: 31 meq/L (ref 19–32)
Calcium: 9.7 mg/dL (ref 8.4–10.5)
Chloride: 99 meq/L (ref 96–112)
Creatinine, Ser: 1.06 mg/dL (ref 0.40–1.20)
GFR: 58.97 mL/min — ABNORMAL LOW (ref >60.00)
Glucose, Bld: 101 mg/dL — ABNORMAL HIGH (ref 70–99)
Potassium: 4.6 meq/L (ref 3.5–5.1)
Sodium: 138 meq/L (ref 135–145)

## 2023-11-28 NOTE — Assessment & Plan Note (Signed)
Repeat trial of estrogen using cream.

## 2023-11-28 NOTE — Assessment & Plan Note (Signed)
She has inquired about pharmacotherapy  , which I have discouraged.  Recommend lifestyle modifications

## 2023-11-28 NOTE — Assessment & Plan Note (Addendum)
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  counselling given . Strongly advised to utilize counselling offered by her employer even if husband will not engage.    The risks and benefits of benzodiazepine use were discussed with patient today including excessive sedation leading to respiratory depression,  impaired thinking/driving, and addiction.  Patient was advised to avoid concurrent use with alcohol, to use medication only as needed and not to share with others  .

## 2023-11-28 NOTE — Assessment & Plan Note (Signed)
Managed with hydrochlorothiazide. .  Cr has increased ;  will repeat  When   Lab Results  Component Value Date   NA 138 11/27/2023   K 4.6 11/27/2023   CL 99 11/27/2023   CO2 31 11/27/2023

## 2023-11-28 NOTE — Addendum Note (Signed)
Addended by: Sherlene Shams on: 11/28/2023 07:31 PM   Modules accepted: Orders

## 2023-12-06 ENCOUNTER — Ambulatory Visit: Payer: 59 | Admitting: Internal Medicine

## 2024-02-25 ENCOUNTER — Encounter: Payer: Self-pay | Admitting: Internal Medicine

## 2024-02-25 ENCOUNTER — Ambulatory Visit (INDEPENDENT_AMBULATORY_CARE_PROVIDER_SITE_OTHER): Payer: 59 | Admitting: Internal Medicine

## 2024-02-25 VITALS — BP 130/78 | HR 59 | Ht 66.0 in | Wt 159.4 lb

## 2024-02-25 DIAGNOSIS — I10 Essential (primary) hypertension: Secondary | ICD-10-CM | POA: Diagnosis not present

## 2024-02-25 DIAGNOSIS — R944 Abnormal results of kidney function studies: Secondary | ICD-10-CM

## 2024-02-25 DIAGNOSIS — R5383 Other fatigue: Secondary | ICD-10-CM | POA: Diagnosis not present

## 2024-02-25 LAB — BASIC METABOLIC PANEL
BUN: 11 mg/dL (ref 6–23)
CO2: 33 meq/L — ABNORMAL HIGH (ref 19–32)
Calcium: 9.8 mg/dL (ref 8.4–10.5)
Chloride: 100 meq/L (ref 96–112)
Creatinine, Ser: 0.89 mg/dL (ref 0.40–1.20)
GFR: 72.61 mL/min (ref >60.00)
Glucose, Bld: 98 mg/dL (ref 70–99)
Potassium: 4 meq/L (ref 3.5–5.1)
Sodium: 139 meq/L (ref 135–145)

## 2024-02-25 MED ORDER — CITALOPRAM HYDROBROMIDE 20 MG PO TABS: ORAL_TABLET | ORAL | 2 refills | Status: DC

## 2024-02-25 MED ORDER — ESTRADIOL 0.1 MG/GM VA CREA: 1.0000 | TOPICAL_CREAM | Freq: Every day | VAGINAL | 12 refills | Status: DC

## 2024-02-25 NOTE — Patient Instructions (Signed)
 If your kidney function is back to normal  we will continue the hydrochlorothiazide  If it is NOT,    we will change your hydrochlorothiazide

## 2024-02-25 NOTE — Progress Notes (Unsigned)
 Subjective:  Patient ID: Caroline Miles, female    DOB: Oct 17, 1966  Age: 58 y.o. MRN: [id number]  CC: There were no encounter diagnoses.   HPI Caroline Miles presents for  Chief Complaint  Patient presents with   Medical Management of Chronic Issues    3 month follow up    1) Dec GFR:  patient was noted to have a lower than normal GFR in December but did not read her My chart message until February so she did not return to have this done.   She takes hydrochlorothiazide daily for management of hypertension   2) marital conflict with Molly Maduro   Outpatient Medications Prior to Visit  Medication Sig Dispense Refill   ALPRAZolam (XANAX) 0.25 MG tablet Take 1 tablet (0.25 mg total) by mouth 2 (two) times daily as needed for anxiety. 30 tablet 2   citalopram (CELEXA) 20 MG tablet TAKE 1 TABLET(20 MG) BY MOUTH DAILY 90 tablet 2   estradiol (ESTRACE) 0.1 MG/GM vaginal cream Place 1 Applicatorful vaginally at bedtime. FOR 14 DAYS, then twice weekly thereafter 42.5 g 12   hydrochlorothiazide (HYDRODIURIL) 25 MG tablet Take 1 tablet (25 mg total) by mouth daily. 90 tablet 3   No facility-administered medications prior to visit.    Review of Systems;  Patient denies headache, fevers, malaise, unintentional weight loss, skin rash, eye pain, sinus congestion and sinus pain, sore throat, dysphagia,  hemoptysis , cough, dyspnea, wheezing, chest pain, palpitations, orthopnea, edema, abdominal pain, nausea, melena, diarrhea, constipation, flank pain, dysuria, hematuria, urinary  Frequency, nocturia, numbness, tingling, seizures,  Focal weakness, Loss of consciousness,  Tremor, insomnia, depression, anxiety, and suicidal ideation.      Objective:  BP (!) 140/78   Pulse (!) 59   Ht 5\' 6"  (1.676 m)   Wt 159 lb 6.4 oz (72.3 kg)   LMP  (LMP Unknown)   SpO2 100%   BMI 25.73 kg/m   BP Readings from Last 3 Encounters:  02/25/24 (!) 140/78  11/27/23 130/78  06/10/23 128/66    Wt Readings from  Last 3 Encounters:  02/25/24 159 lb 6.4 oz (72.3 kg)  11/27/23 155 lb (70.3 kg)  06/10/23 156 lb 3.2 oz (70.9 kg)    Physical Exam  Lab Results  Component Value Date   HGBA1C 6.2 06/10/2023   HGBA1C 6.3 04/11/2022   HGBA1C 6.1 (H) 05/16/2021    Lab Results  Component Value Date   CREATININE 1.06 11/27/2023   CREATININE 0.86 06/10/2023   CREATININE 0.75 06/12/2022    Lab Results  Component Value Date   WBC 5.2 06/10/2023   HGB 12.7 06/10/2023   HCT 38.9 06/10/2023   PLT 335.0 06/10/2023   GLUCOSE 101 (H) 11/27/2023   CHOL 169 06/10/2023   TRIG 129.0 06/10/2023   HDL 40.90 06/10/2023   LDLDIRECT 121.0 06/10/2023   LDLCALC 102 (H) 06/10/2023   ALT 17 06/10/2023   AST 25 06/10/2023   NA 138 11/27/2023   K 4.6 11/27/2023   CL 99 11/27/2023   CREATININE 1.06 11/27/2023   BUN 14 11/27/2023   CO2 31 11/27/2023   TSH 2.23 06/10/2023   HGBA1C 6.2 06/10/2023   MICROALBUR <0.7 06/10/2023    MM 3D SCREENING MAMMOGRAM BILATERAL BREAST Result Date: 07/29/2023 CLINICAL DATA:  Screening. EXAM: DIGITAL SCREENING BILATERAL MAMMOGRAM WITH TOMOSYNTHESIS AND CAD TECHNIQUE: Bilateral screening digital craniocaudal and mediolateral oblique mammograms were obtained. Bilateral screening digital breast tomosynthesis was performed. The images were evaluated with computer-aided detection. COMPARISON:  Previous exam(s). ACR Breast Density Category c: The breasts are heterogeneously dense, which may obscure small masses. FINDINGS: There are no findings suspicious for malignancy. IMPRESSION: No mammographic evidence of malignancy. A result letter of this screening mammogram will be mailed directly to the patient. RECOMMENDATION: Screening mammogram in one year. (Code:SM-B-01Y) BI-RADS CATEGORY  1: Negative. Electronically Signed   By: Baird Lyons M.D.   On: 07/29/2023 08:07    Assessment & Plan:  .There are no diagnoses linked to this encounter.   I spent 34 minutes on the day of this face to  face encounter reviewing patient's  most recent visit with cardiology,  nephrology,  and neurology,  prior relevant surgical and non surgical procedures, recent  labs and imaging studies, counseling on weight management,  reviewing the assessment and plan with patient, and post visit ordering and reviewing of  diagnostics and therapeutics with patient  .   Follow-up: No follow-ups on file.   Sherlene Shams, MD

## 2024-02-26 ENCOUNTER — Encounter: Payer: Self-pay | Admitting: Internal Medicine

## 2024-02-26 DIAGNOSIS — R944 Abnormal results of kidney function studies: Secondary | ICD-10-CM | POA: Insufficient documentation

## 2024-02-26 LAB — CBC WITH DIFFERENTIAL/PLATELET
Basophils Absolute: 0 10*3/uL (ref 0.0–0.1)
Basophils Relative: 0.8 % (ref 0.0–3.0)
Eosinophils Absolute: 0.3 10*3/uL (ref 0.0–0.7)
Eosinophils Relative: 4 % (ref 0.0–5.0)
HCT: 39.6 % (ref 36.0–46.0)
Hemoglobin: 13.2 g/dL (ref 12.0–15.0)
Lymphocytes Relative: 30.9 % (ref 12.0–46.0)
Lymphs Abs: 1.9 10*3/uL (ref 0.7–4.0)
MCHC: 33.4 g/dL (ref 30.0–36.0)
MCV: 93.6 fl (ref 78.0–100.0)
Monocytes Absolute: 0.5 10*3/uL (ref 0.1–1.0)
Monocytes Relative: 7.4 % (ref 3.0–12.0)
Neutro Abs: 3.5 10*3/uL (ref 1.4–7.7)
Neutrophils Relative %: 56.9 % (ref 43.0–77.0)
Platelets: 342 10*3/uL (ref 150.0–400.0)
RBC: 4.23 Mil/uL (ref 3.87–5.11)
RDW: 14.8 % (ref 11.5–15.5)
WBC: 6.2 10*3/uL (ref 4.0–10.5)

## 2024-02-26 NOTE — Assessment & Plan Note (Signed)
 Resolved.  Continue hct

## 2024-02-26 NOTE — Assessment & Plan Note (Signed)
 Managed with hydrochlorothiazide. .  Repeat Cr and GFR area normal ; no change  Lab Results  Component Value Date   NA 139 02/25/2024   K 4.0 02/25/2024   CL 100 02/25/2024   CO2 33 (H) 02/25/2024

## 2024-03-03 ENCOUNTER — Encounter: Payer: Self-pay | Admitting: Internal Medicine

## 2024-04-02 ENCOUNTER — Other Ambulatory Visit: Payer: Self-pay | Admitting: Internal Medicine

## 2024-07-07 ENCOUNTER — Other Ambulatory Visit: Payer: Self-pay | Admitting: Internal Medicine

## 2024-07-07 DIAGNOSIS — Z1231 Encounter for screening mammogram for malignant neoplasm of breast: Secondary | ICD-10-CM

## 2024-07-23 ENCOUNTER — Other Ambulatory Visit: Payer: Self-pay | Admitting: Internal Medicine

## 2024-07-27 ENCOUNTER — Ambulatory Visit
Admission: RE | Admit: 2024-07-27 | Discharge: 2024-07-27 | Disposition: A | Discharge status: Home / Self Care | Source: Ambulatory Visit | Attending: Internal Medicine | Admitting: Internal Medicine

## 2024-07-27 DIAGNOSIS — Z1231 Encounter for screening mammogram for malignant neoplasm of breast: Secondary | ICD-10-CM | POA: Diagnosis present

## 2024-09-14 ENCOUNTER — Ambulatory Visit: Payer: Self-pay

## 2024-09-14 NOTE — Telephone Encounter (Signed)
 FYI Only or Action Required?: Action required by provider: request for appointment.  Patient was last seen in primary care on 02/25/2024 by Caroline Verneita CROME, MD.  Called Nurse Triage reporting Cough.  Symptoms began several days ago.  Interventions attempted: Rest, hydration, or home remedies.  Symptoms are: gradually worsening. Requests to see Dr. Marylynn only, no availability. Please advise pt.  Triage Disposition: See HCP Within 4 Hours (Or PCP Triage)  Patient/caregiver understands and will follow disposition?: No, wishes to speak with PCP   Copied from CRM #[id number]. Topic: Clinical - Red Word Triage >> Sep 14, 2024  8:42 AM Nathanel DEL wrote: Red Word that prompted transfer to Nurse Triage: wheezing, congestion, elbow swollen, hurts a lot,hands swollen Reason for Disposition  Wheezing is present  Answer Assessment - Initial Assessment Questions 1. ONSET: When did the cough begin?      Last Wednesday 2. SEVERITY: How bad is the cough today?      severe 3. SPUTUM: Describe the color of your sputum (e.g., none, dry cough; clear, white, yellow, green)     yellow 4. HEMOPTYSIS: Are you coughing up any blood? If Yes, ask: How much? (e.g., flecks, streaks, tablespoons, etc.)     no 5. DIFFICULTY BREATHING: Are you having difficulty breathing? If Yes, ask: How bad is it? (e.g., mild, moderate, severe)      mild 6. FEVER: Do you have a fever? If Yes, ask: What is your temperature, how was it measured, and when did it start?     no 7. CARDIAC HISTORY: Do you have any history of heart disease? (e.g., heart attack, congestive heart failure)      no 8. LUNG HISTORY: Do you have any history of lung disease?  (e.g., pulmonary embolus, asthma, emphysema)     no 9. PE RISK FACTORS: Do you have a history of blood clots? (or: recent major surgery, recent prolonged travel, bedridden)     no 10. OTHER SYMPTOMS: Do you have any other symptoms? (e.g., runny nose, wheezing,  chest pain)       Wheezing, sore throat. Swollen right elbow 11. PREGNANCY: Is there any chance you are pregnant? When was your last menstrual period?       no 12. TRAVEL: Have you traveled out of the country in the last month? (e.g., travel history, exposures)       no  Protocols used: Cough - Acute Productive-A-AH

## 2024-09-14 NOTE — Telephone Encounter (Signed)
 Spoke with pt and scheduled her to see Kenney Roys, NP tomorrow.

## 2024-09-15 ENCOUNTER — Encounter: Payer: Self-pay | Admitting: Family

## 2024-09-15 ENCOUNTER — Ambulatory Visit (INDEPENDENT_AMBULATORY_CARE_PROVIDER_SITE_OTHER): Admitting: Family

## 2024-09-15 VITALS — BP 138/86 | HR 73 | Temp 98.4°F | Ht 66.0 in | Wt 158.6 lb

## 2024-09-15 DIAGNOSIS — M5412 Radiculopathy, cervical region: Secondary | ICD-10-CM

## 2024-09-15 DIAGNOSIS — Z1152 Encounter for screening for COVID-19: Secondary | ICD-10-CM | POA: Diagnosis not present

## 2024-09-15 LAB — POC COVID19 BINAXNOW: SARS Coronavirus 2 Ag: NEGATIVE

## 2024-09-15 MED ORDER — PREDNISONE 20 MG PO TABS: 20.0000 mg | ORAL_TABLET | Freq: Every day | ORAL | 0 refills | Status: DC

## 2024-09-15 MED ORDER — PROMETHAZINE-DM 6.25-15 MG/5ML PO SYRP: 5.0000 mL | ORAL_SOLUTION | Freq: Four times a day (QID) | ORAL | 0 refills | Status: DC | PRN

## 2024-09-16 NOTE — Progress Notes (Signed)
 Acute Office Visit  Subjective:     Patient ID: Caroline Miles, female    DOB: 05/31/1966, 58 y.o.   MRN: [id number]  Chief Complaint  Patient presents with   Cough    HPI Patient is in today with c/o cough, congestion x 1 week Has been taking Zyrtec and Dayquil that has helped some.   Also reports having right shoulder and neck pain x 1 week. Rates pain 6/10, achy, sore and radiates down the right arm. Worse with movement. Has tried massages that help but the pain returns. Has a history of cervical radiculopathy and has had MRI that showed a bulging disc. Has has steroid injections in the neck that have also helped.    Review of Systems  Constitutional: Negative.   HENT:  Positive for congestion.   Respiratory:  Positive for cough.   Cardiovascular: Negative.   Musculoskeletal:  Positive for neck pain.       Right should pain  Neurological: Negative.   Endo/Heme/Allergies: Negative.   Psychiatric/Behavioral: Negative.    All other systems reviewed and are negative.  Past Medical History:  Diagnosis Date   Allergy    Anxiety    Pre-diabetes 09/16/2017    Social History   Socioeconomic History   Marital status: Married    Spouse name: Not on file   Number of children: Not on file   Years of education: Not on file   Highest education level: Some college, no degree  Occupational History   Not on file  Tobacco Use   Smoking status: Never   Smokeless tobacco: Never  Vaping Use   Vaping status: Never Used  Substance and Sexual Activity   Alcohol use: Yes    Comment: social   Drug use: No   Sexual activity: Yes  Other Topics Concern   Not on file  Social History Narrative   Not on file   Social Drivers of Health   Financial Resource Strain: Low Risk  (11/26/2023)   Overall Financial Resource Strain (CARDIA)    Difficulty of Paying Living Expenses: Not very hard  Food Insecurity: No Food Insecurity (11/26/2023)   Hunger Vital Sign    Worried About Running Out  of Food in the Last Year: Never true    Ran Out of Food in the Last Year: Never true  Transportation Needs: No Transportation Needs (11/26/2023)   PRAPARE - Administrator, Civil Service (Medical): No    Lack of Transportation (Non-Medical): No  Physical Activity: Insufficiently Active (11/26/2023)   Exercise Vital Sign    Days of Exercise per Week: 3 days    Minutes of Exercise per Session: 30 min  Stress: Stress Concern Present (11/26/2023)   [employer] of Occupational Health - Occupational Stress Questionnaire    Feeling of Stress : To some extent  Social Connections: Unknown (11/26/2023)   Social Connection and Isolation Panel    Frequency of Communication with Friends and Family: More than three times a week    Frequency of Social Gatherings with Friends and Family: More than three times a week    Attends Religious Services: Patient declined    Active Member of Clubs or Organizations: Yes    Attends Engineer, structural: More than 4 times per year    Marital Status: Married  Catering manager Violence: Not on file    Past Surgical History:  Procedure Laterality Date   CESAREAN SECTION  1997   COLONOSCOPY WITH PROPOFOL  N/A 07/06/2021  Procedure: COLONOSCOPY WITH PROPOFOL ;  Surgeon: Therisa Bi, MD;  Location: [facility] END[facility]COPY;  Service: Gastroenterology;  Laterality: N/A;    Family History  Problem Relation Age of Onset   Hyperlipidemia Mother    Hearing loss Mother    Diabetes Mother    Hypertension Mother    Stroke Mother    Heart disease Father    Hearing loss Father    Diabetes Father    Prostate cancer Father    Hypertension Sister    Hyperlipidemia Sister    Hearing loss Sister    Diabetes Sister    Arthritis Sister    Diabetes Brother    Breast cancer Neg Hx     No Known Allergies  Current Outpatient Medications on File Prior to Visit  Medication Sig Dispense Refill   ALPRAZolam  (XANAX ) 0.25 MG tablet Take 1 tablet (0.25 mg total)  by mouth 2 (two) times daily as needed for anxiety. 30 tablet 2   citalopram  (CELEXA ) 20 MG tablet TAKE 1 TABLET DAILY 90 tablet 2   estradiol  (ESTRACE ) 0.1 MG/GM vaginal cream Place 1 Applicatorful vaginally at bedtime. FOR 14 DAYS, then twice weekly thereafter 42.5 g 12   hydrochlorothiazide  (HYDRODIURIL ) 25 MG tablet TAKE 1 TABLET BY MOUTH ONCE DAILY 90 tablet 0   No current facility-administered medications on file prior to visit.    BP 138/86   Pulse 73   Temp 98.4 F (36.9 C)   Ht 5' 6 (1.676 m)   Wt 158 lb 9.6 oz (71.9 kg)   LMP  (LMP Unknown)   SpO2 94%   BMI 25.60 kg/m chart      Objective:    BP 138/86   Pulse 73   Temp 98.4 F (36.9 C)   Ht 5' 6 (1.676 m)   Wt 158 lb 9.6 oz (71.9 kg)   LMP  (LMP Unknown)   SpO2 94%   BMI 25.60 kg/m    Physical Exam Vitals and nursing note reviewed.  Constitutional:      Appearance: Normal appearance. She is normal weight.  Neck:      Comments: Tenderness to palpation. Pain with rotation. No obvious swelling.  Cardiovascular:     Rate and Rhythm: Normal rate and regular rhythm.     Pulses: Normal pulses.     Heart sounds: Normal heart sounds.  Pulmonary:     Effort: Pulmonary effort is normal.     Breath sounds: Normal breath sounds.  Musculoskeletal:        General: Normal range of motion.     Cervical back: Normal range of motion and neck supple. Tenderness present.  Skin:    General: Skin is warm and dry.  Neurological:     General: No focal deficit present.     Mental Status: She is alert and oriented to person, place, and time. Mental status is at baseline.  Psychiatric:        Mood and Affect: Mood normal.        Behavior: Behavior normal.        Thought Content: Thought content normal.    Results for orders placed or performed in visit on 09/15/24  POC COVID-19 BinaxNow  Result Value Ref Range   SARS Coronavirus 2 Ag Negative Negative        Assessment & Plan:   Problem List Items Addressed  This Visit   None Visit Diagnoses       Encounter for screening for COVID-19    -  Primary   Relevant Orders   POC COVID-19 BinaxNow (Completed)     Cervical radiculopathy           Meds ordered this encounter  Medications   predniSONE  (DELTASONE ) 20 MG tablet    Sig: Take 1 tablet (20 mg total) by mouth daily with breakfast. 3 tabs po qam x 3 days, 2 tabs po qam x 3 days, 1 tab po qam x 3 days    Dispense:  18 tablet    Refill:  0   promethazine -dextromethorphan (PROMETHAZINE -DM) 6.25-15 MG/5ML syrup    Sig: Take 5 mLs by mouth 4 (four) times daily as needed.    Dispense:  118 mL    Refill:  0   Call the office if symptoms worsen or persist. No follow-ups on file.  Enriqueta Augusta B Ortha Metts, FNP

## 2024-11-04 ENCOUNTER — Other Ambulatory Visit: Payer: Self-pay | Admitting: Internal Medicine

## 2024-11-05 NOTE — Telephone Encounter (Signed)
 LMTCB. Need to schedule pt for an appt with Dr. Tullo. She is overdue for her appt. Medication has been refilled for 30 days.

## 2024-11-27 ENCOUNTER — Ambulatory Visit: Admitting: Internal Medicine

## 2024-11-27 ENCOUNTER — Encounter: Payer: Self-pay | Admitting: Internal Medicine

## 2024-11-27 VITALS — BP 134/72 | HR 69 | Ht 66.0 in | Wt 159.0 lb

## 2024-11-27 DIAGNOSIS — E611 Iron deficiency: Secondary | ICD-10-CM

## 2024-11-27 DIAGNOSIS — G2581 Restless legs syndrome: Secondary | ICD-10-CM

## 2024-11-27 DIAGNOSIS — N9411 Superficial (introital) dyspareunia: Secondary | ICD-10-CM

## 2024-11-27 DIAGNOSIS — Z0001 Encounter for general adult medical examination with abnormal findings: Secondary | ICD-10-CM

## 2024-11-27 DIAGNOSIS — R7303 Prediabetes: Secondary | ICD-10-CM

## 2024-11-27 DIAGNOSIS — I10 Essential (primary) hypertension: Secondary | ICD-10-CM

## 2024-11-27 DIAGNOSIS — R944 Abnormal results of kidney function studies: Secondary | ICD-10-CM

## 2024-11-27 DIAGNOSIS — H1012 Acute atopic conjunctivitis, left eye: Secondary | ICD-10-CM

## 2024-11-27 DIAGNOSIS — R252 Cramp and spasm: Secondary | ICD-10-CM

## 2024-11-27 LAB — CBC WITH DIFFERENTIAL/PLATELET
Basophils Absolute: 0 K/uL (ref 0.0–0.1)
Basophils Relative: 0.9 % (ref 0.0–3.0)
Eosinophils Absolute: 0.2 K/uL (ref 0.0–0.7)
Eosinophils Relative: 3 % (ref 0.0–5.0)
HCT: 39.8 % (ref 36.0–46.0)
Hemoglobin: 13.4 g/dL (ref 12.0–15.0)
Lymphocytes Relative: 31 % (ref 12.0–46.0)
Lymphs Abs: 1.7 K/uL (ref 0.7–4.0)
MCHC: 33.5 g/dL (ref 30.0–36.0)
MCV: 84.4 fl (ref 78.0–100.0)
Monocytes Absolute: 0.4 K/uL (ref 0.1–1.0)
Monocytes Relative: 7.6 % (ref 3.0–12.0)
Neutro Abs: 3.2 K/uL (ref 1.4–7.7)
Neutrophils Relative %: 57.5 % (ref 43.0–77.0)
Platelets: 315 K/uL (ref 150.0–400.0)
RBC: 4.72 Mil/uL (ref 3.87–5.11)
RDW: 14 % (ref 11.5–15.5)
WBC: 5.6 K/uL (ref 4.0–10.5)

## 2024-11-27 LAB — COMPREHENSIVE METABOLIC PANEL WITH GFR
ALT: 15 U/L (ref 0–35)
AST: 18 U/L (ref 0–37)
Albumin: 4.8 g/dL (ref 3.5–5.2)
Alkaline Phosphatase: 58 U/L (ref 39–117)
BUN: 15 mg/dL (ref 6–23)
CO2: 33 meq/L — ABNORMAL HIGH (ref 19–32)
Calcium: 9.7 mg/dL (ref 8.4–10.5)
Chloride: 98 meq/L (ref 96–112)
Creatinine, Ser: 0.79 mg/dL (ref 0.40–1.20)
GFR: 83.33 mL/min (ref >60.00)
Glucose, Bld: 97 mg/dL (ref 70–99)
Potassium: 3.9 meq/L (ref 3.5–5.1)
Sodium: 139 meq/L (ref 135–145)
Total Bilirubin: 0.7 mg/dL (ref 0.2–1.2)
Total Protein: 7.5 g/dL (ref 6.0–8.3)

## 2024-11-27 LAB — IBC + FERRITIN
Ferritin: 9.3 ng/mL — ABNORMAL LOW (ref 10.0–291.0)
Iron: 59 ug/dL (ref 42–145)
Saturation Ratios: 10.4 % — ABNORMAL LOW (ref 20.0–50.0)
TIBC: 565.6 ug/dL — ABNORMAL HIGH (ref 250.0–450.0)
Transferrin: 404 mg/dL — ABNORMAL HIGH (ref 212.0–360.0)

## 2024-11-27 LAB — HEMOGLOBIN A1C: Hgb A1c MFr Bld: 6.1 % (ref 4.6–6.5)

## 2024-11-27 LAB — MAGNESIUM: Magnesium: 2.1 mg/dL (ref 1.5–2.5)

## 2024-11-27 MED ORDER — OLOPATADINE HCL 0.1 % OP SOLN: 1.0000 [drp] | Freq: Two times a day (BID) | OPHTHALMIC | 12 refills | Status: AC

## 2024-11-27 MED ORDER — HYDROCHLOROTHIAZIDE 25 MG PO TABS: 25.0000 mg | ORAL_TABLET | Freq: Every day | ORAL | 3 refills | Status: AC

## 2024-11-27 MED ORDER — CITALOPRAM HYDROBROMIDE 20 MG PO TABS: 20.0000 mg | ORAL_TABLET | Freq: Every day | ORAL | 2 refills | Status: DC

## 2024-11-27 NOTE — Progress Notes (Unsigned)
 Subjective:  Patient ID: Caroline Miles, female    DOB: Dec 27, 1965  Age: 58 y.o. MRN: [id number]  CC: There were no encounter diagnoses.   HPI Caroline Miles presents for No chief complaint on file.  1) Menopause :  symptoms have improved since started HRT testosterone/estrogen pellet implantation by [facility] . Libido has returned and marriage is saved!!   2) one week histor yof eye redess , itching,  watery discharge,  saw Dr Christobal,  eye doctor,  told she had a viral infection ,  no abx    Outpatient Medications Prior to Visit  Medication Sig Dispense Refill   ALPRAZolam  (XANAX ) 0.25 MG tablet Take 1 tablet (0.25 mg total) by mouth 2 (two) times daily as needed for anxiety. 30 tablet 2   citalopram  (CELEXA ) 20 MG tablet TAKE 1 TABLET DAILY 90 tablet 2   estradiol  (ESTRACE ) 0.1 MG/GM vaginal cream Place 1 Applicatorful vaginally at bedtime. FOR 14 DAYS, then twice weekly thereafter 42.5 g 12   hydrochlorothiazide  (HYDRODIURIL ) 25 MG tablet TAKE 1 TABLET BY MOUTH ONCE DAILY *NEED APPOINTMENT 30 tablet 0   predniSONE  (DELTASONE ) 20 MG tablet Take 1 tablet (20 mg total) by mouth daily with breakfast. 3 tabs po qam x 3 days, 2 tabs po qam x 3 days, 1 tab po qam x 3 days 18 tablet 0   promethazine -dextromethorphan (PROMETHAZINE -DM) 6.25-15 MG/5ML syrup Take 5 mLs by mouth 4 (four) times daily as needed. 118 mL 0   No facility-administered medications prior to visit.    Review of Systems;  Patient denies headache, fevers, malaise, unintentional weight loss, skin rash, eye pain, sinus congestion and sinus pain, sore throat, dysphagia,  hemoptysis , cough, dyspnea, wheezing, chest pain, palpitations, orthopnea, edema, abdominal pain, nausea, melena, diarrhea, constipation, flank pain, dysuria, hematuria, urinary  Frequency, nocturia, numbness, tingling, seizures,  Focal weakness, Loss of consciousness,  Tremor, insomnia, depression, anxiety, and suicidal ideation.       Objective:  LMP  (LMP Unknown)   BP Readings from Last 3 Encounters:  09/15/24 138/86  02/26/24 130/78  11/27/23 130/78    Wt Readings from Last 3 Encounters:  09/15/24 158 lb 9.6 oz (71.9 kg)  02/25/24 159 lb 6.4 oz (72.3 kg)  11/27/23 155 lb (70.3 kg)    Physical Exam  Lab Results  Component Value Date   HGBA1C 6.2 06/10/2023   HGBA1C 6.3 04/11/2022   HGBA1C 6.1 (H) 05/16/2021    Lab Results  Component Value Date   CREATININE 0.89 02/25/2024   CREATININE 1.06 11/27/2023   CREATININE 0.86 06/10/2023    Lab Results  Component Value Date   WBC 6.2 02/25/2024   HGB 13.2 02/25/2024   HCT 39.6 02/25/2024   PLT 342.0 02/25/2024   GLUCOSE 98 02/25/2024   CHOL 169 06/10/2023   TRIG 129.0 06/10/2023   HDL 40.90 06/10/2023   LDLDIRECT 121.0 06/10/2023   LDLCALC 102 (H) 06/10/2023   ALT 17 06/10/2023   AST 25 06/10/2023   NA 139 02/25/2024   K 4.0 02/25/2024   CL 100 02/25/2024   CREATININE 0.89 02/25/2024   BUN 11 02/25/2024   CO2 33 (H) 02/25/2024   TSH 2.23 06/10/2023   HGBA1C 6.2 06/10/2023    MM 3D SCREENING MAMMOGRAM BILATERAL BREAST Result Date: 07/29/2024 CLINICAL DATA:  Screening. EXAM: DIGITAL SCREENING BILATERAL MAMMOGRAM WITH TOMOSYNTHESIS AND CAD TECHNIQUE: Bilateral screening digital craniocaudal and mediolateral oblique mammograms were obtained. Bilateral screening digital breast tomosynthesis was performed.  The images were evaluated with computer-aided detection. COMPARISON:  Previous exam(s). ACR Breast Density Category c: The breasts are heterogeneously dense, which may obscure small masses. FINDINGS: There are no findings suspicious for malignancy. IMPRESSION: No mammographic evidence of malignancy. A result letter of this screening mammogram will be mailed directly to the patient. RECOMMENDATION: Screening mammogram in one year. (Code:SM-B-01Y) BI-RADS CATEGORY  1: Negative. Electronically Signed   By: Toribio Agreste M.D.   On: 07/29/2024 15:47     Assessment & Plan:  .There are no diagnoses linked to this encounter.   I spent 34 minutes on the day of this face to face encounter reviewing patient's  most recent visit with cardiology,  nephrology,  and neurology,  prior relevant surgical and non surgical procedures, recent  labs and imaging studies, counseling on weight management,  reviewing the assessment and plan with patient, and post visit ordering and reviewing of  diagnostics and therapeutics with patient  .   Follow-up: No follow-ups on file.   Verneita LITTIE Kettering, MD

## 2024-11-27 NOTE — Patient Instructions (Addendum)
   I'm so glad the HRT is restoring your marriage!!!  Please ask your  HRT provider  about why you are not taking progesterone since you have a uterus . This may be planned in  future visit ,  but please check . Any woman who still has a uterus should be taking progesterone IF you are receiving estrogen .     I am prescribing Patanol  for allergic conjunctivitis  which may be why your left eye is still itching

## 2024-11-28 ENCOUNTER — Ambulatory Visit: Payer: Self-pay | Admitting: Internal Medicine

## 2024-11-28 DIAGNOSIS — G2581 Restless legs syndrome: Secondary | ICD-10-CM | POA: Insufficient documentation

## 2024-11-28 DIAGNOSIS — H1012 Acute atopic conjunctivitis, left eye: Secondary | ICD-10-CM | POA: Insufficient documentation

## 2024-11-28 MED ORDER — IRON (FERROUS SULFATE) 325 (65 FE) MG PO TABS: 325.0000 mg | ORAL_TABLET | Freq: Every day | ORAL | 0 refills | Status: DC

## 2024-11-28 NOTE — Assessment & Plan Note (Signed)
Resolved with HRT.Caroline Miles

## 2024-11-28 NOTE — Assessment & Plan Note (Signed)
 Resolved.  Continue hct   Lab Results  Component Value Date   NA 139 11/27/2024   K 3.9 11/27/2024   CL 98 11/27/2024   CO2 33 (H) 11/27/2024   Lab Results  Component Value Date   CREATININE 0.79 11/27/2024

## 2024-11-28 NOTE — Progress Notes (Signed)
 Patient ID: Caroline Miles, female    DOB: 1966-01-03  Age: 58 y.o. MRN: [id number]  The patient is here for annual preventive examination and management of other chronic and acute problems.   The risk factors are reflected in the social history.   The roster of all physicians providing medical care to patient - is listed in the Snapshot section of the chart.   Activities of daily living:  The patient is 100% independent in all ADLs: dressing, toileting, feeding as well as independent mobility   Home safety : The patient has smoke detectors in the home. They wear seatbelts.  There are no unsecured firearms at home. There is no violence in the home.    There is no risks for hepatitis, STDs or HIV. There is no   history of blood transfusion. They have no travel history to infectious disease endemic areas of the world.   The patient has seen their dentist in the last six month. They have seen their eye doctor in the last year. The patinet  denies slight hearing difficulty with regard to whispered voices and some television programs.  They have deferred audiologic testing in the last year.  They do not  have excessive sun exposure. Discussed the need for sun protection: hats, long sleeves and use of sunscreen if there is significant sun exposure.    Diet: the importance of a healthy diet is discussed. They do have a healthy diet.   The benefits of regular aerobic exercise were discussed. The patient  exercises  3 to 5 days per week  for  60 minutes.    Depression screen: there are no signs or vegative symptoms of depression- irritability, change in appetite, anhedonia, sadness/tearfullness.   The following portions of the patient's history were reviewed and updated as appropriate: allergies, current medications, past family history, past medical history,  past surgical history, past social history  and problem list.   Visual acuity was not assessed per patient preference since the patient has regular  follow up with an  ophthalmologist. Hearing and body mass index were assessed and reviewed.    During the course of the visit the patient was educated and counseled about appropriate screening and preventive services including : fall prevention , diabetes screening, nutrition counseling, colorectal cancer screening, and recommended immunizations.    Chief Complaint:   1) Menopause :  dyspareunia symptoms have improved since started HRT testosterone/estrogen pellet implantation by [facility] in [facility] . Libido has returned and marital conflict has resolved now that sexual intimacy has returned   2) one week history of left eye redness , itching,  watery discharge,  saw Dr Caroline Miles,  eye doctor,  told she had a viral infection ,  no abx given.    3)  Patient is taking her medications as prescribed and notes no adverse effects.  Home BP readings have been done about once per week and are  generally < 130/80 .  She is avoiding added salt in her diet and walking regularly about 3 times per week for exercise  .     Review of Symptoms  Patient denies headache, fevers, malaise, unintentional weight loss, skin rash, eye pain, sinus congestion and sinus pain, sore throat, dysphagia,  hemoptysis , cough, dyspnea, wheezing, chest pain, palpitations, orthopnea, edema, abdominal pain, nausea, melena, diarrhea, constipation, flank pain, dysuria, hematuria, urinary  Frequency, nocturia, numbness, tingling, seizures,  Focal weakness, Loss of consciousness,  Tremor, insomnia, depression, anxiety, and suicidal ideation.  Physical Exam:  BP 134/72   Pulse 69   Ht 5' 6 (1.676 m)   Wt 159 lb (72.1 kg)   LMP  (LMP Unknown)   SpO2 98%   BMI 25.66 kg/m    Physical Exam Vitals reviewed.  Constitutional:      General: She is not in acute distress.    Appearance: Normal appearance. She is normal weight. She is not ill-appearing, toxic-appearing or diaphoretic.  HENT:     Head: Normocephalic.  Eyes:      General: No scleral icterus.       Right eye: No discharge.        Left eye: No discharge.     Conjunctiva/sclera: Conjunctivae normal.  Cardiovascular:     Rate and Rhythm: Normal rate and regular rhythm.     Heart sounds: Normal heart sounds.  Pulmonary:     Effort: Pulmonary effort is normal. No respiratory distress.     Breath sounds: Normal breath sounds.  Musculoskeletal:        General: Normal range of motion.  Skin:    General: Skin is warm and dry.  Neurological:     General: No focal deficit present.     Mental Status: She is alert and oriented to person, place, and time. Mental status is at baseline.  Psychiatric:        Mood and Affect: Mood normal.        Behavior: Behavior normal.        Thought Content: Thought content normal.        Judgment: Judgment normal.     Assessment and Plan: Restless legs Assessment & Plan: New onset,  secondary to iron  deficiency without anemia.  Supplementation advised and workup for causes under way   Orders: -     IBC + Ferritin -     CBC with Differential/Platelet  Pre-diabetes -     Hemoglobin A1c  Decreased GFR Assessment & Plan: Resolved.  Continue hct   Lab Results  Component Value Date   NA 139 11/27/2024   K 3.9 11/27/2024   CL 98 11/27/2024   CO2 33 (H) 11/27/2024   Lab Results  Component Value Date   CREATININE 0.79 11/27/2024     Orders: -     Comprehensive metabolic panel with GFR  Muscle cramps -     Magnesium  Essential hypertension Assessment & Plan: Managed with hydrochlorothiazide . .  Repeat Cr and GFR area normal ; no change  Lab Results  Component Value Date   NA 139 11/27/2024   K 3.9 11/27/2024   CL 98 11/27/2024   CO2 33 (H) 11/27/2024       Encounter for general adult medical examination with abnormal findings Assessment & Plan: age appropriate education and counseling updated, referrals for preventative services and immunizations addressed, dietary and smoking counseling  addressed, most recent labs reviewed.  I have personally reviewed and have noted:   1) the patient's medical and social history 2) The pt's use of alcohol, tobacco, and illicit drugs 3) The patient's current medications and supplements 4) Functional ability including ADL's, fall risk, home safety risk, hearing and visual impairment 5) Diet and physical activities 6) Evidence for depression or mood disorder 7) The patient's height, weight, and BMI have been recorded in the chart   I have made referrals, and provided counseling and education based on review of the above    Introital dyspareunia Assessment & Plan: Resolved with HRT   Iron  deficiency -  Iron  (Ferrous Sulfate ); Take 325 mg by mouth daily.  Dispense: 90 tablet; Refill: 0 -     Hemoccult Cards (X3 cards); Future  Allergic conjunctivitis, acute, left Assessment & Plan: Symptoms present for 7 days,  no discharge or eye pain.  Patanol eye drops prescribed    Other orders -     Olopatadine  HCl; Place 1 drop into both eyes 2 (two) times daily.  Dispense: 5 mL; Refill: 12 -     hydroCHLOROthiazide ; Take 1 tablet (25 mg total) by mouth daily.  Dispense: 90 tablet; Refill: 3 -     Citalopram  Hydrobromide; Take 1 tablet (20 mg total) by mouth daily.  Dispense: 90 tablet; Refill: 2    Return in about 6 months (around 05/28/2025).  Verneita LITTIE Kettering, MD

## 2024-11-28 NOTE — Assessment & Plan Note (Signed)

## 2024-11-28 NOTE — Assessment & Plan Note (Signed)
 Managed with hydrochlorothiazide . .  Repeat Cr and GFR area normal ; no change  Lab Results  Component Value Date   NA 139 11/27/2024   K 3.9 11/27/2024   CL 98 11/27/2024   CO2 33 (H) 11/27/2024

## 2024-11-28 NOTE — Assessment & Plan Note (Signed)
 New onset,  secondary to iron  deficiency without anemia.  Supplementation advised and workup for causes under way

## 2024-11-28 NOTE — Assessment & Plan Note (Signed)
 Symptoms present for 7 days,  no discharge or eye pain.  Patanol eye drops prescribed

## 2024-12-07 ENCOUNTER — Encounter: Payer: Self-pay | Admitting: Internal Medicine

## 2024-12-07 NOTE — Telephone Encounter (Signed)
 FYI

## 2024-12-09 ENCOUNTER — Ambulatory Visit (INDEPENDENT_AMBULATORY_CARE_PROVIDER_SITE_OTHER)

## 2024-12-09 ENCOUNTER — Other Ambulatory Visit: Payer: Self-pay

## 2024-12-09 DIAGNOSIS — E611 Iron deficiency: Secondary | ICD-10-CM

## 2024-12-09 LAB — FECAL OCCULT BLOOD, IMMUNOCHEMICAL: Fecal Occult Bld: POSITIVE — AB

## 2024-12-09 NOTE — Addendum Note (Signed)
 Addended by: HARRIETTE RAISIN on: 12/09/2024 11:15 AM   Modules accepted: Orders

## 2024-12-11 ENCOUNTER — Ambulatory Visit: Payer: Self-pay | Admitting: Internal Medicine

## 2024-12-11 DIAGNOSIS — E611 Iron deficiency: Secondary | ICD-10-CM

## 2024-12-11 DIAGNOSIS — R195 Other fecal abnormalities: Secondary | ICD-10-CM

## 2024-12-14 ENCOUNTER — Other Ambulatory Visit: Payer: Self-pay | Admitting: Internal Medicine

## 2024-12-14 DIAGNOSIS — E611 Iron deficiency: Secondary | ICD-10-CM

## 2024-12-25 ENCOUNTER — Telehealth: Payer: Self-pay

## 2024-12-25 NOTE — Telephone Encounter (Signed)
 Patient called and thought that she only needed a colonoscopy. However, I told her that she needed an appointment before since she had iron  deficiency so she is to see a provider first. Please contact patient and schedule her an appointment. Thank you.

## 2025-01-06 ENCOUNTER — Other Ambulatory Visit: Payer: Self-pay | Admitting: Internal Medicine

## 2025-02-09 ENCOUNTER — Ambulatory Visit: Admitting: Family Medicine

## 2025-05-28 ENCOUNTER — Ambulatory Visit: Admitting: Internal Medicine
